# Patient Record
Sex: Male | Born: 1950 | Race: White | Hispanic: No | Marital: Married | Smoking: Never smoker
Health system: Southern US, Community
[De-identification: ages and names within clinical notes are randomized; demographics above are authoritative.]

## PROBLEM LIST (undated history)

## (undated) DIAGNOSIS — E291 Testicular hypofunction: Secondary | ICD-10-CM

## (undated) DIAGNOSIS — G4700 Insomnia, unspecified: Secondary | ICD-10-CM

## (undated) DIAGNOSIS — G4730 Sleep apnea, unspecified: Secondary | ICD-10-CM

## (undated) DIAGNOSIS — E785 Hyperlipidemia, unspecified: Secondary | ICD-10-CM

## (undated) DIAGNOSIS — N400 Enlarged prostate without lower urinary tract symptoms: Secondary | ICD-10-CM

## (undated) DIAGNOSIS — Z87442 Personal history of urinary calculi: Secondary | ICD-10-CM

## (undated) HISTORY — DX: Personal history of urinary calculi: Z87.442

## (undated) HISTORY — DX: Testicular hypofunction: E29.1

## (undated) HISTORY — DX: Benign prostatic hyperplasia without lower urinary tract symptoms: N40.0

## (undated) HISTORY — DX: Hyperlipidemia, unspecified: E78.5

## (undated) HISTORY — PX: ESOPHAGEAL DILATION: SHX303

## (undated) HISTORY — PX: APPENDECTOMY: SHX54

## (undated) HISTORY — PX: THYROID CYST EXCISION: SHX2511

## (undated) HISTORY — DX: Insomnia, unspecified: G47.00

## (undated) HISTORY — PX: LASER OF PROSTATE W/ GREEN LIGHT PVP: SHX1953

## (undated) HISTORY — DX: Sleep apnea, unspecified: G47.30

## (undated) NOTE — Anesthesia Postprocedure Evaluation (Signed)
 Formatting of this note is different from the original. Patient: Devin Adams Procedures performed: Procedure(s): EGD BALLOON DILATION REMOVAL FOREIGN BODY  Assessment: No complications from anesthesia  Last vitals:  Vitals Value Taken Time  BP 146/75 12/09/23 11:20  Temp 36.6 C 12/09/23 11:19  Pulse 76 12/09/23 11:20  Resp 16 12/09/23 11:20  SpO2 96 % 12/09/23 11:20   Post vital signs: stable  Respiratory: unassisted Airway patency: patent Cardiovascular: stable and blood pressure at baseline Mental Status: Awake, Responds verbally, Answers questions appropriately, and Performs simple tasks  Post-anesthesia pain: adequate analgesia  Hydration: euvolemic  Anesthetic complications: no  Nausea: No   Vomiting: No    Electronically signed by Duwaine JONELLE Lenis, DO at 12/09/2023 13:21 MDT

## (undated) NOTE — Anesthesia Preprocedure Evaluation (Signed)
 Formatting of this note is different from the original.  Anesthesia Evaluation   Patient summary reviewed and Nursing notes reviewed   No history of anesthetic complications  Airway  No increased risk of difficult airway Mallampati: III TM distance: <3 FB Neck ROM: limited   Dental  Patient has facial hair   Pulmonary   (+) sleep apnea on CPAP, decreased breath sounds  Cardiovascular   EKG reviewed Rhythm: regular Rate: normal   Neuro/Psych    Comments: Insomnia    GI/Hepatic/Renal   (+) chronic renal disease  Comments: H/O EOE;  s/p several food impactions, s/p three esophageal dilations  H/O nephrolithiasis   Endo/Other  (+) arthritis (s/p TKA in 2024), Positive Cancer (Skin CA), obesity   Abdominal  (+) obese  Other findings: Pt sitting up spitting saliva into emesis bag    Anesthesia Plan  ASA 2 - emergent   general  (R/B/A of GETA/RSI dw Pt and wife;  All Q's answered.  Electronically signed and performed by: Megan R Gatlin, DO, 12/09/2023 11:03    ) intravenous induction  Anesthetic plan and risks discussed with patient. Use of blood products discussed with patient who.    Preoperative Evaluation Electronically Signed and Performed By: Megan R Gatlin, DO, 12/09/2023 9:54    Electronically signed by Duwaine JONELLE Lenis, DO at 12/09/2023 13:04 MDT

---

## 2004-08-30 ENCOUNTER — Ambulatory Visit: Payer: Self-pay

## 2006-09-14 ENCOUNTER — Encounter: Payer: Self-pay | Admitting: Cardiology

## 2006-10-08 ENCOUNTER — Ambulatory Visit: Payer: Self-pay | Admitting: Cardiology

## 2006-10-14 ENCOUNTER — Ambulatory Visit: Payer: Self-pay | Admitting: Cardiology

## 2010-07-04 ENCOUNTER — Ambulatory Visit: Payer: Self-pay | Admitting: Family Medicine

## 2010-08-13 NOTE — Assessment & Plan Note (Signed)
[facility] OFFICE NOTE   NAME:Ess, GYAN CAMBRE                     MRN:          [id number]  DATE:10/08/2006                            DOB:          25-May-1950    This is a cardiology consultation/outpatient cardiac catheterization  note.   Mr. Wehmeyer is a delightful, 60 year old, married, white male who comes  with his wife today for a second opinion concerning the need for a heart  catheterization.   HISTORY OF PRESENT ILLNESS:  He is 60 years of age, married, has 3  children.  He has very few risk factors for coronary disease other than  male sex and age.  He has been having some shortness of breath with  exertion over the last 6 months.  He was referred to Dr. Welton Flakes by Dr.  Vonita Moss who performed a stress nuclear study.  He exercised for 10  minutes and 10 seconds reaching a met level of 13.5.  Blood pressure  increased to 170/72.  Maximum heart rate was well above target.  There  was no ST segment depression.  His EF was 67%, but he was told he had an  anterior wall fixed defect and an inferior reversible defect.  Cardiac  catheterization was recommended.  Ironically, all wall motion was  normal.  I do not have a lipid profile but he does not have this as a  risk factor.   MEDICATIONS:  1. Doxazosin 8 mg p.o. daily.  2. Gabapentin 300 mg p.o. q.h.s.  3. Multivitamin.   PAST MEDICAL HISTORY:  He has had appendectomy in 2005.   He is intolerant to an antibiotic but cannot remember the name which  causes a rash.   FAMILY HISTORY:  Negative for premature coronary disease.   SOCIAL HISTORY:  He is in [employer].  He talks on the phone a lot.  He does  not exercise on a regular basis.  He does not smoke, does not drink or  use recreational drugs.   REVIEW OF SYSTEMS:  He does have sleep apnea.  He has an enlarged  prostate and has problems with urinary frequency and urgency.  He has a  history of  gastroesophageal reflux.  The rest of the review of systems  is negative.   PHYSICAL EXAMINATION:  VITAL SIGNS:  His blood pressure is 128/68.  His  pulse is 64 and regular.  His baseline ECG is normal.  His weight is  223.  He is 5 feet 10 inches.  HEENT:  Normocephalic atraumatic.  PERRLA.  Extraocular movements  intact.  Sclerae are clear.  Facial symmetry is normal.  Dentition  satisfactory.  NECK:  Supple.  Carotids are full without bruits.  There is no  thyromegaly.  There is no JVD.  LUNGS:  Clear.  HEART:  Reveals a soft S1 S2.  PMI is poorly appreciated.  ABDOMEN:  Slightly protuberant.  Good bowel sounds.  No obvious  organomegaly.  EXTREMITIES:  Reveal no edema.  Pulses are brisk.  NEUROLOGIC:  Intact.   ASSESSMENT:  Dyspnea on exertion, rule  out obstructive coronary artery  disease or ischemic equivalency.   I have asked Mr. Gellert to obtain a CD of his nuclear images.  If they  do indeed show these changes I would recommend a heart catheterization.  Indication, risks, and potential benefits have been discussed.  He would  like this done as an outpatient at [facility].  If they do not  and it looks relatively normal, I would recommend risk factor  modification, particularly exercise 3 hours a week and weight reduction.  I would also like to know what his lipids are and, the patient's LDL  less than 100.     Thomas C. Daleen Squibb, MD, [facility]  Electronically Signed    TCW/MedQ  DD: 10/08/2006  DT: 10/08/2006  Job #: [id number]   cc:   Vonita Moss, Dr.

## 2010-08-13 NOTE — Assessment & Plan Note (Signed)
[facility] OFFICE NOTE   NAME:Ashmore, CHRISTOFFER CURRIER                     MRN:          [id number]  DATE:10/14/2006                            DOB:          [birth year]    Mr. Granville returns today to discuss further his need for heart  catheterization.  Please see my original note on October 08, 2006.   Again, I pressed him hard and he is really not having any symptoms of  angina other than becoming slightly short of breath with running up the  steps.  We asked him to obtain a CD scan of his nuclear stress study  with Dr. Welton Flakes, but was told they do not do CDs.   The report says 67% EF with normal wall motion with an anterior wall  fixed defect, inferior wall reversible defect.  His exercise tolerance  was excellent and his test was adequate.   He did obtain static color images and Dr. Antoine Poche and I have looked at  these.  They are completely normal as we can see.   I have had another 20 minute discussion with he and his wife.  I have  not recommended heart catheterization.  I reviewed ischemic symptoms,  both exertional and at rest and how to activate 911.  I have recommended  a fasting lipid panel which we will arrange so I can review.  We will  also call Dr. Milta Deiters office and see if there is any way we can get a CD.  It does not sound like we can.   Otherwise, I will see him back on a p.r.n. basis.   After this discussion, he and his wife understand and agree with the  plan.     Thomas C. Daleen Squibb, MD, [facility]  Electronically Signed    TCW/MedQ  DD: 10/14/2006  DT: 10/15/2006  Job #: [id number]   cc:   Vonita Moss, MD

## 2010-09-06 ENCOUNTER — Ambulatory Visit: Payer: Self-pay | Admitting: Unknown Physician Specialty

## 2010-09-19 ENCOUNTER — Encounter: Payer: Self-pay | Admitting: Cardiovascular Disease

## 2010-11-26 ENCOUNTER — Ambulatory Visit: Payer: Self-pay | Admitting: Unknown Physician Specialty

## 2011-02-26 ENCOUNTER — Ambulatory Visit: Payer: Self-pay | Admitting: Cardiovascular Disease

## 2011-03-06 ENCOUNTER — Encounter: Payer: Self-pay | Admitting: Cardiovascular Disease

## 2011-03-06 ENCOUNTER — Ambulatory Visit: Payer: Self-pay | Admitting: Cardiovascular Disease

## 2011-03-13 ENCOUNTER — Ambulatory Visit (INDEPENDENT_AMBULATORY_CARE_PROVIDER_SITE_OTHER): Payer: 59 | Admitting: Cardiovascular Disease

## 2011-03-13 ENCOUNTER — Encounter: Payer: Self-pay | Admitting: Cardiovascular Disease

## 2011-03-13 DIAGNOSIS — Z0000 Encounter for general adult medical examination without abnormal findings: Secondary | ICD-10-CM

## 2011-03-13 DIAGNOSIS — R0789 Other chest pain: Secondary | ICD-10-CM

## 2011-03-13 DIAGNOSIS — Z7189 Other specified counseling: Secondary | ICD-10-CM | POA: Insufficient documentation

## 2011-03-13 DIAGNOSIS — R0602 Shortness of breath: Secondary | ICD-10-CM

## 2011-03-13 DIAGNOSIS — R002 Palpitations: Secondary | ICD-10-CM

## 2011-03-13 DIAGNOSIS — E785 Hyperlipidemia, unspecified: Secondary | ICD-10-CM | POA: Insufficient documentation

## 2011-03-13 NOTE — Assessment & Plan Note (Signed)
His cholesterol numbers from 2000 and were excellent. We did suggest that he try to decrease his weight as this would help achieve a lower LDL.

## 2011-03-13 NOTE — Assessment & Plan Note (Signed)
Overall he is doing well. We have asked him to watch his weight and increase his exercise. Cholesterol and blood pressure well controlled, no family history of coronary artery disease, no diabetes. In general, I'm very happy with how he is doing.

## 2011-03-13 NOTE — Progress Notes (Signed)
Patient ID: Devin Adams, male    DOB: 14-Jan-1951, 60 y.o.   MRN: [id number]  HPI Comments: [name] is a [place] and 60 year old gentleman, patient of Dr. Dossie Arbour, with a history of hyperlipidemia, Dr. Sleep apnea, low testosterone, shortness of breath with heavy exertion with previous stress test in 2008 who presents by referral for further evaluation.  He reports that in general he is doing well. His weight is up a little bit through the winter though stable over the past several years. He is asymptomatic when walking on flat surfaces. He does have some shortness of breath with stairs. This has been stable and this was the reason for his previous stress test. Notes indicate that his stress test was evaluated by Dr. Daleen Squibb and Dr. Antoine Poche at [place] and it was not felt that he needed further workup such as a cardiac catheterization. The inferior wall was initially read as showing some reversible changes the evaluation of static images by the above doctors did not suggest ischemia. No cardiac catheterization was performed and he has done well since then.  He denies any progression in chest pain or shortness of breath. He is tolerating his medications well with no complaints. He has been tolerating lovastatin without any muscle ache or joint pain.  Total cholesterol from November 2000 was 156, LDL 97, HDL 37  EKG shows sinus bradycardia with rate 53 beats per minute with no significant ST or T wave changes   Outpatient Encounter Prescriptions as of 03/13/2011  Medication Sig Dispense Refill  . aspirin 81 MG tablet Take 81 mg by mouth daily.        Marland Kitchen doxazosin (CARDURA) 4 MG tablet Take 4 mg by mouth at bedtime.        . gabapentin (NEURONTIN) 300 MG capsule Take two tablets at bedtime.      . lovastatin (MEVACOR) 40 MG tablet Take 40 mg by mouth at bedtime.        . mometasone (NASONEX) 50 MCG/ACT nasal spray Place 2 sprays into the nose daily.        . naproxen (NAPROSYN) 500 MG tablet  Take 500 mg by mouth daily.           Review of Systems  Constitutional: Negative.   HENT: Negative.   Eyes: Negative.   Respiratory: Negative.        Some mild shortness of breath with stairs  Cardiovascular: Negative.   Gastrointestinal: Negative.   Musculoskeletal: Negative.   Skin: Negative.   Neurological: Negative.   Hematological: Negative.   Psychiatric/Behavioral: Negative.   All other systems reviewed and are negative.    BP 128/74  Pulse 53  Ht 5\' 11"  (1.803 m)  Wt 231 lb 8 oz (105.008 kg)  BMI 32.29 kg/m2  Physical Exam  Nursing note and vitals reviewed. Constitutional: He is oriented to person, place, and time. He appears well-developed and well-nourished.  HENT:  Head: Normocephalic.  Nose: Nose normal.  Mouth/Throat: Oropharynx is clear and moist.  Eyes: Conjunctivae are normal. Pupils are equal, round, and reactive to light.  Neck: Normal range of motion. Neck supple. No JVD present.  Cardiovascular: Normal rate, regular rhythm, S1 normal, S2 normal, normal heart sounds and intact distal pulses.  Exam reveals no gallop and no friction rub.   No murmur heard. Pulmonary/Chest: Effort normal and breath sounds normal. No respiratory distress. He has no wheezes. He has no rales. He exhibits no tenderness.  Abdominal: Soft. Bowel sounds are normal.  He exhibits no distension. There is no tenderness.  Musculoskeletal: Normal range of motion. He exhibits no edema and no tenderness.  Lymphadenopathy:    He has no cervical adenopathy.  Neurological: He is alert and oriented to person, place, and time. Coordination normal.  Skin: Skin is warm and dry. No rash noted. No erythema.  Psychiatric: He has a normal mood and affect. His behavior is normal. Judgment and thought content normal.           Assessment and Plan

## 2011-03-13 NOTE — Assessment & Plan Note (Signed)
He has mild stable shortness of breath with heavy exertion and stairs. Given his previous testing for similar symptoms with no significant change over the past 4 years, no further testing has been ordered. I suggested if his symptoms get worse with exertion, that he call our office for a treadmill study. After long discussion of the various testing protocols and options, we will watch him for now. He will be low risk of underlying coronary artery disease given her family history, no diabetes, no smoking history and well-controlled cholesterol. We have asked him to watch his weight as this could be contributing to his breathing. We have also asked him to work on his conditioning.

## 2011-03-13 NOTE — Patient Instructions (Addendum)
You are doing well. No medication changes were made.  Please call us if you have new issues that need to be addressed before your next appt.  The office will contact you for a follow up Appt. In 12 months  

## 2011-09-15 ENCOUNTER — Ambulatory Visit: Payer: Self-pay | Admitting: Urology

## 2011-12-18 ENCOUNTER — Ambulatory Visit: Payer: Self-pay | Admitting: Unknown Physician Specialty

## 2012-04-16 ENCOUNTER — Ambulatory Visit (INDEPENDENT_AMBULATORY_CARE_PROVIDER_SITE_OTHER): Payer: 59 | Admitting: Cardiovascular Disease

## 2012-04-16 ENCOUNTER — Encounter: Payer: Self-pay | Admitting: Cardiovascular Disease

## 2012-04-16 VITALS — BP 138/78 | HR 63 | Ht 70.0 in | Wt 228.8 lb

## 2012-04-16 DIAGNOSIS — E785 Hyperlipidemia, unspecified: Secondary | ICD-10-CM

## 2012-04-16 DIAGNOSIS — R0602 Shortness of breath: Secondary | ICD-10-CM

## 2012-04-16 NOTE — Assessment & Plan Note (Signed)
Cholesterol is at goal on the current lipid regimen. No changes to the medications were made. We have encouraged he try to lose several pounds.

## 2012-04-16 NOTE — Progress Notes (Signed)
Patient ID: Devin Adams, male    DOB: Jun 19, 1950, 62 y.o.   MRN: [id number]  HPI Comments: [name] is a  62 year old gentleman, patient of Dr. Dossie Arbour, with a history of hyperlipidemia,  Sleep apnea, low testosterone, previous history of shortness of breath with heavy exertion with previous stress test in 2008 who presents for routine followup  He reports that in general he is doing well. His weight is up a little bit through the winter though stable over the past several years.  He denies any changes. He is active with no complaints  Notes indicate that his stress test 2008 was evaluated by Dr. Daleen Squibb and Dr. Antoine Poche at [place] and it was not felt that he needed further workup such as a cardiac catheterization. The inferior wall was initially read as showing some reversible changes the evaluation of static images by the above doctors did not suggest ischemia. No cardiac catheterization was performed and he has done well since then.  Most recent lab work shows total cholesterol in the 160 range, LDL 75  EKG shows sinus bradycardia with rate 63 beats per minute with no significant ST or T wave changes   Outpatient Encounter Prescriptions as of 04/16/2012  Medication Sig Dispense Refill  . aspirin 81 MG tablet Take 81 mg by mouth daily.        Marland Kitchen doxazosin (CARDURA) 4 MG tablet Take 4 mg by mouth at bedtime.        . gabapentin (NEURONTIN) 300 MG capsule Take two tablets at bedtime.      . lovastatin (MEVACOR) 40 MG tablet Take 40 mg by mouth at bedtime.        . mometasone (NASONEX) 50 MCG/ACT nasal spray Place 2 sprays into the nose daily.        . naproxen (NAPROSYN) 500 MG tablet Take 500 mg by mouth daily.           Review of Systems  Constitutional: Negative.   HENT: Negative.   Eyes: Negative.   Respiratory: Negative.        Some mild shortness of breath with stairs  Cardiovascular: Negative.   Gastrointestinal: Negative.   Musculoskeletal: Negative.   Skin: Negative.    Neurological: Negative.   Hematological: Negative.   Psychiatric/Behavioral: Negative.   All other systems reviewed and are negative.    BP 138/78  Pulse 63  Ht 5\' 10"  (1.778 m)  Wt 228 lb 12 oz (103.76 kg)  BMI 32.82 kg/m2  Physical Exam  Nursing note and vitals reviewed. Constitutional: He is oriented to person, place, and time. He appears well-developed and well-nourished.  HENT:  Head: Normocephalic.  Nose: Nose normal.  Mouth/Throat: Oropharynx is clear and moist.  Eyes: Conjunctivae normal are normal. Pupils are equal, round, and reactive to light.  Neck: Normal range of motion. Neck supple. No JVD present.  Cardiovascular: Normal rate, regular rhythm, S1 normal, S2 normal, normal heart sounds and intact distal pulses.  Exam reveals no gallop and no friction rub.   No murmur heard. Pulmonary/Chest: Effort normal and breath sounds normal. No respiratory distress. He has no wheezes. He has no rales. He exhibits no tenderness.  Abdominal: Soft. Bowel sounds are normal. He exhibits no distension. There is no tenderness.  Musculoskeletal: Normal range of motion. He exhibits no edema and no tenderness.  Lymphadenopathy:    He has no cervical adenopathy.  Neurological: He is alert and oriented to person, place, and time. Coordination normal.  Skin: Skin  is warm and dry. No rash noted. No erythema.  Psychiatric: He has a normal mood and affect. His behavior is normal. Judgment and thought content normal.           Assessment and Plan

## 2012-04-16 NOTE — Assessment & Plan Note (Signed)
No progression in his symptoms. Overall he feels well and is active

## 2012-04-16 NOTE — Patient Instructions (Addendum)
You are doing well. No medication changes were made.  Please call us if you have new issues that need to be addressed before your next appt.  Your physician wants you to follow-up in: 12 months.  You will receive a reminder letter in the mail two months in advance. If you don't receive a letter, please call our office to schedule the follow-up appointment. 

## 2012-09-06 DIAGNOSIS — N400 Enlarged prostate without lower urinary tract symptoms: Secondary | ICD-10-CM | POA: Insufficient documentation

## 2012-09-06 DIAGNOSIS — E291 Testicular hypofunction: Secondary | ICD-10-CM | POA: Insufficient documentation

## 2012-09-06 DIAGNOSIS — R3911 Hesitancy of micturition: Secondary | ICD-10-CM | POA: Insufficient documentation

## 2013-04-18 ENCOUNTER — Ambulatory Visit: Payer: 59 | Admitting: Cardiovascular Disease

## 2013-05-02 ENCOUNTER — Ambulatory Visit: Payer: 59 | Admitting: Cardiovascular Disease

## 2013-05-03 ENCOUNTER — Ambulatory Visit (INDEPENDENT_AMBULATORY_CARE_PROVIDER_SITE_OTHER): Payer: 59 | Admitting: Cardiovascular Disease

## 2013-05-03 ENCOUNTER — Encounter: Payer: Self-pay | Admitting: Cardiovascular Disease

## 2013-05-03 VITALS — BP 122/64 | HR 61 | Ht 71.0 in | Wt 236.5 lb

## 2013-05-03 DIAGNOSIS — R079 Chest pain, unspecified: Secondary | ICD-10-CM

## 2013-05-03 DIAGNOSIS — E785 Hyperlipidemia, unspecified: Secondary | ICD-10-CM

## 2013-05-03 DIAGNOSIS — E669 Obesity, unspecified: Secondary | ICD-10-CM | POA: Insufficient documentation

## 2013-05-03 DIAGNOSIS — R0602 Shortness of breath: Secondary | ICD-10-CM

## 2013-05-03 DIAGNOSIS — Z0000 Encounter for general adult medical examination without abnormal findings: Secondary | ICD-10-CM

## 2013-05-03 MED ORDER — LOVASTATIN 40 MG PO TABS: 80.0000 mg | ORAL_TABLET | Freq: Every day | ORAL | Status: DC

## 2013-05-03 NOTE — Assessment & Plan Note (Signed)
Recommended he lose weight, start regular exercise program. Also recommended he increase his lovastatin to 40 mg twice a day. He is determined to lose weight in 6 months time.

## 2013-05-03 NOTE — Assessment & Plan Note (Signed)
He does report having some shortness of breath with stairs. Long discussion about this with him. He is okay on flat surfaces, mild exertional related fatigue with stairs and hills. It was found that his symptoms are predominantly secondary to conditioning and prior weight. Suggested he call our office if symptoms get worse. Stress test could be done.

## 2013-05-03 NOTE — Assessment & Plan Note (Signed)
We have encouraged continued exercise, careful diet management in an effort to lose weight. 

## 2013-05-03 NOTE — Assessment & Plan Note (Signed)
Main focus today was on weight loss, getting his cholesterol down, Also monitoring for new symptoms of shortness of breath with exertion. He will call us for new symptoms

## 2013-05-03 NOTE — Progress Notes (Signed)
Patient ID: Devin Adams, male    DOB: 1950/12/06, 63 y.o.   MRN: [id number]  HPI Comments: Mr. Devin Adams is a  63 -year-old gentleman, patient of Dr. Jeananne Rama, with a history of hyperlipidemia,  Sleep apnea, low testosterone, previous history of shortness of breath with heavy exertion with previous stress test in 2008 who presents for routine followup  Since his last clinic visit, he has gained over 10 pounds. He has been less active, not doing any regular exercise, eating more. Cholesterol has climbed from 160 range now up to 193, LDL of to 90 from 75 He continues on lovastatin 40 mg daily in general he is doing well.   He is active with no complaints  Notes indicate that his stress test 2008 was evaluated by Dr. Verl Blalock and Dr. Percival Spanish at [place] and it was not felt that he needed further workup such as a cardiac catheterization. The inferior wall was initially read as showing some reversible changes the evaluation of static images by the above doctors did not suggest ischemia. No cardiac catheterization was performed and he has done well since then.  EKG shows sinus bradycardia with rate 61 beats per minute with no significant ST or T wave changes   Outpatient Encounter Prescriptions as of 05/03/2013  Medication Sig  . aspirin 81 MG tablet Take 81 mg by mouth daily.    Marland Kitchen doxazosin (CARDURA) 4 MG tablet Take 4 mg by mouth at bedtime.    . gabapentin (NEURONTIN) 300 MG capsule Take two tablets at bedtime.  . lovastatin (MEVACOR) 40 MG tablet Take 40 mg by mouth at bedtime.    . mometasone (NASONEX) 50 MCG/ACT nasal spray Place 2 sprays into the nose daily.      Review of Systems  Constitutional: Negative.   HENT: Negative.   Eyes: Negative.   Respiratory: Negative.        Some mild shortness of breath with stairs  Cardiovascular: Negative.   Gastrointestinal: Negative.   Endocrine: Negative.   Musculoskeletal: Negative.   Skin: Negative.   Allergic/Immunologic: Negative.    Neurological: Negative.   Hematological: Negative.   Psychiatric/Behavioral: Negative.   All other systems reviewed and are negative.    BP 122/64  Pulse 61  Ht 5\' 11"  (1.803 m)  Wt 236 lb 8 oz (107.276 kg)  BMI 33.00 kg/m2  Physical Exam  Nursing note and vitals reviewed. Constitutional: He is oriented to person, place, and time. He appears well-developed and well-nourished.  HENT:  Head: Normocephalic.  Nose: Nose normal.  Mouth/Throat: Oropharynx is clear and moist.  Eyes: Conjunctivae are normal. Pupils are equal, round, and reactive to light.  Neck: Normal range of motion. Neck supple. No JVD present.  Cardiovascular: Normal rate, regular rhythm, S1 normal, S2 normal, normal heart sounds and intact distal pulses.  Exam reveals no gallop and no friction rub.   No murmur heard. Pulmonary/Chest: Effort normal and breath sounds normal. No respiratory distress. He has no wheezes. He has no rales. He exhibits no tenderness.  Abdominal: Soft. Bowel sounds are normal. He exhibits no distension. There is no tenderness.  Musculoskeletal: Normal range of motion. He exhibits no edema and no tenderness.  Lymphadenopathy:    He has no cervical adenopathy.  Neurological: He is alert and oriented to person, place, and time. Coordination normal.  Skin: Skin is warm and dry. No rash noted. No erythema.  Psychiatric: He has a normal mood and affect. His behavior is normal. Judgment and thought  content normal.      Assessment and Plan

## 2013-05-03 NOTE — Patient Instructions (Signed)
You are doing well. Please increase the lovastatin up to 80 mg daily  Please call us if you have new issues that need to be addressed before your next appt.  Your physician wants you to follow-up in: 12 months.  You will receive a reminder letter in the mail two months in advance. If you don't receive a letter, please call our office to schedule the follow-up appointment.

## 2013-11-28 ENCOUNTER — Ambulatory Visit: Payer: Self-pay | Admitting: Podiatry

## 2013-12-21 ENCOUNTER — Ambulatory Visit (INDEPENDENT_AMBULATORY_CARE_PROVIDER_SITE_OTHER): Payer: 59 | Admitting: Podiatry

## 2013-12-21 ENCOUNTER — Encounter: Payer: Self-pay | Admitting: Podiatry

## 2013-12-21 VITALS — BP 104/67 | HR 72 | Resp 16 | Ht 70.0 in | Wt 225.0 lb

## 2013-12-21 DIAGNOSIS — M722 Plantar fascial fibromatosis: Secondary | ICD-10-CM

## 2013-12-22 NOTE — Progress Notes (Signed)
Ritesh presents today states that his right heel is still bothering him. States that the last injection over a year ago did nothing for him. He's ready to have surgery performed to resolve his symptoms. He states it is starting to affect her daily activities. Chronic pain in the morning and after sitting for a while.  Objective: Vital signs are stable he is alert and oriented x3. Denies any change in his past medical history medications or allergies. He denies fever chills nausea vomiting muscle aches and pains other than right heel pain. Pulses are palpable right he has pain on palpation medial continued tubercle of the right heel at the plantar fascial calcaneal insertion site. The plantar fascia is taut and tender.  Assessment: Chronic intractable plantar fasciitis right heel.  Plan: Discussed etiology pathology conservative versus versus surgical therapies. He was scanned for a new set of orthotics today and we went over consent form today line bylined number by number giving him ample time to ask questions he saw regarding an endoscopic plantar fasciotomy of the right heel. I answered all the questions regarding this procedure to the best of my ability in layman's terms. He understood it was amenable to it and signed all 3 pages of the consent form. We did discuss a possible postop complications which may include but are not limited to postop pain bleeding swelling infection need for further surgery overcorrection under correction chronic pain and numbness. I will followup with him in the near future for surgery.

## 2014-01-11 ENCOUNTER — Ambulatory Visit (INDEPENDENT_AMBULATORY_CARE_PROVIDER_SITE_OTHER): Payer: 59 | Admitting: Podiatry

## 2014-01-11 DIAGNOSIS — M722 Plantar fascial fibromatosis: Secondary | ICD-10-CM

## 2014-01-11 NOTE — Patient Instructions (Signed)

## 2014-01-11 NOTE — Progress Notes (Signed)
Pt presents for orthotic pick up written and verbal instructions are given. He states it is doing much better he seems to have improved since he is been taking his own foot as well as wearing his orthotics and sleeping with his night splint. He states that he may like to hold off on surgery depending on how he feels in December.  Objective: Vital signs are stable he is alert and oriented x3 his orthotics were dispensed today.  Assessment: Resolving plantar fasciitis.  Plan: Or nursing staff demonstrated to him how to strap his own foot today. I will followup with him in one month if necessary.

## 2014-03-07 ENCOUNTER — Encounter: Payer: Self-pay | Admitting: *Deleted

## 2014-03-07 NOTE — Progress Notes (Signed)
Pt called and cancelled surgery for 12.18.15. Stated he is doing much better with wearing his night boot.

## 2014-03-21 ENCOUNTER — Ambulatory Visit: Payer: Self-pay | Admitting: Urology

## 2014-03-21 LAB — HEMOGLOBIN: HGB: 14.4 g/dL (ref 13.0–18.0)

## 2014-03-22 ENCOUNTER — Encounter: Payer: 59 | Admitting: Podiatry

## 2014-03-28 ENCOUNTER — Ambulatory Visit: Payer: Self-pay | Admitting: Urology

## 2014-05-10 ENCOUNTER — Ambulatory Visit: Payer: Self-pay | Admitting: Cardiovascular Disease

## 2014-05-19 ENCOUNTER — Ambulatory Visit (INDEPENDENT_AMBULATORY_CARE_PROVIDER_SITE_OTHER): Payer: 59 | Admitting: Cardiovascular Disease

## 2014-05-19 ENCOUNTER — Encounter: Payer: Self-pay | Admitting: Cardiovascular Disease

## 2014-05-19 VITALS — BP 128/90 | HR 58 | Ht 70.0 in | Wt 228.8 lb

## 2014-05-19 DIAGNOSIS — Z0000 Encounter for general adult medical examination without abnormal findings: Secondary | ICD-10-CM

## 2014-05-19 DIAGNOSIS — E669 Obesity, unspecified: Secondary | ICD-10-CM

## 2014-05-19 DIAGNOSIS — R0602 Shortness of breath: Secondary | ICD-10-CM

## 2014-05-19 DIAGNOSIS — R7301 Impaired fasting glucose: Secondary | ICD-10-CM

## 2014-05-19 DIAGNOSIS — E785 Hyperlipidemia, unspecified: Secondary | ICD-10-CM

## 2014-05-19 NOTE — Assessment & Plan Note (Signed)
Cholesterol is at goal on the current lipid regimen. No changes to the medications were made.  

## 2014-05-19 NOTE — Assessment & Plan Note (Signed)
Labs reviewed with him in detail. Recommended weight loss. Discussed food choices in an effort to lose weight

## 2014-05-19 NOTE — Assessment & Plan Note (Signed)
Very mild chronic shortness of breath likely secondary to deconditioning, weight. Recommended a regular exercise program

## 2014-05-19 NOTE — Progress Notes (Signed)
Patient ID: Devin Adams, male    DOB: 05/03/1950, 64 y.o.   MRN: [id number]  HPI Comments: Devin Adams is a  64 -year-old gentleman, patient of Dr. Jeananne Rama, with a history of hyperlipidemia,  Sleep apnea, low testosterone, previous history of shortness of breath with heavy exertion with previous stress test in 2008 who presents for routine followup of his hyperlipidemia  In follow-up today, weight is down from his prior clinic visit but up from his baseline of 220 pounds. Currently 228 pounds He reports that he recently had a family member pass and neighbors were bringing him lots of food. Also has been eating out more Recent blood work showing total cholesterol 148, LDL 64, HDL 45 Fasting glucose 120s Results were discussed with him in detail He is taking lovastatin 40 mg daily Denies any significant chest pain, shortness of breath with exertion. His active at baseline, no regular exercise program  EKG done on today's visit shows normal sinus rhythm with rate 58 bpm, no significant ST or T-wave changes  Other past medical history Notes indicate that his stress test 2008 was evaluated by Dr. Verl Blalock and Dr. Percival Spanish at [facility] and it was not felt that he needed further workup such as a cardiac catheterization. The inferior wall was initially read as showing some reversible changes the evaluation of static images by the above doctors did not suggest ischemia. No cardiac catheterization was performed and he has done well since then.  No Known Allergies  Outpatient Encounter Prescriptions as of 05/19/2014  Medication Sig  . aspirin 81 MG tablet Take 81 mg by mouth daily.    Marland Kitchen gabapentin (NEURONTIN) 300 MG capsule Take two tablets at bedtime.  . lovastatin (MEVACOR) 40 MG tablet Take 2 tablets (80 mg total) by mouth at bedtime.  . mometasone (NASONEX) 50 MCG/ACT nasal spray Place 2 sprays into the nose daily.    . [DISCONTINUED] doxazosin (CARDURA) 4 MG tablet Take 4 mg by mouth at bedtime.       Past Medical History  Diagnosis Date  . Hyperlipidemia   . Insomnia, unspecified   . Sleep apnea   . Other testicular hypofunction   . Enlarged prostate     Past Surgical History  Procedure Laterality Date  . Appendectomy    . Esophageal dilation    . Thyroid cyst excision      Social History  reports that he has never smoked. He does not have any smokeless tobacco history on file. He reports that he drinks alcohol. He reports that he does not use illicit drugs.  Family History family history includes Heart failure in an other family member.  Review of Systems  Constitutional: Negative.   Respiratory: Negative.        Some mild shortness of breath with stairs  Cardiovascular: Negative.   Gastrointestinal: Negative.   Endocrine: Negative.   Musculoskeletal: Negative.   Skin: Negative.   Neurological: Negative.   Hematological: Negative.   Psychiatric/Behavioral: Negative.   All other systems reviewed and are negative.   BP 128/90 mmHg  Pulse 58  Ht 5\' 10"  (1.778 m)  Wt 228 lb 12 oz (103.76 kg)  BMI 32.82 kg/m2  Physical Exam  Constitutional: He is oriented to person, place, and time. He appears well-developed and well-nourished.  HENT:  Head: Normocephalic.  Nose: Nose normal.  Mouth/Throat: Oropharynx is clear and moist.  Eyes: Conjunctivae are normal. Pupils are equal, round, and reactive to light.  Neck: Normal range of motion.  Neck supple. No JVD present.  Cardiovascular: Normal rate, regular rhythm, S1 normal, S2 normal, normal heart sounds and intact distal pulses.  Exam reveals no gallop and no friction rub.   No murmur heard. Pulmonary/Chest: Effort normal and breath sounds normal. No respiratory distress. He has no wheezes. He has no rales. He exhibits no tenderness.  Abdominal: Soft. Bowel sounds are normal. He exhibits no distension. There is no tenderness.  Musculoskeletal: Normal range of motion. He exhibits no edema or tenderness.   Lymphadenopathy:    He has no cervical adenopathy.  Neurological: He is alert and oriented to person, place, and time. Coordination normal.  Skin: Skin is warm and dry. No rash noted. No erythema.  Psychiatric: He has a normal mood and affect. His behavior is normal. Judgment and thought content normal.      Assessment and Plan   Nursing note and vitals reviewed.

## 2014-05-19 NOTE — Patient Instructions (Signed)
You are doing well. No medication changes were made.  Please call us if you have new issues that need to be addressed before your next appt.  Your physician wants you to follow-up in: 12 months.  You will receive a reminder letter in the mail two months in advance. If you don't receive a letter, please call our office to schedule the follow-up appointment. 

## 2014-05-19 NOTE — Assessment & Plan Note (Signed)
We have encouraged continued exercise, careful diet management in an effort to lose weight. 

## 2014-07-22 NOTE — H&P (Signed)
PATIENT NAME:  Devin Adams, Devin Adams MR#:  [id number] DATE OF BIRTH:  October 23, 1950  DATE OF ADMISSION:  03/21/2014  SCHEDULED DATE OF SURGERY: 03/28/2014.   CHIEF COMPLAINT: Difficulty voiding.   HISTORY OF PRESENT ILLNESS: Mr. Mozer is a 64 year old white male with a greater than four year history of difficulty voiding. He had failed medical therapy and underwent TUMT July 28. His symptoms have not improved significantly. He comes in now for photovaporization of the prostate with a green light laser. Prosthetic ultrasound indicated a 48.4 gram prostate. PSA was 4.0 ng/mL. AUA symptom score was 25 with a quality of life score of 7. He is currently on tamsulosin and doxazosin for benign prostatic hypertrophy.   PAST MEDICAL HISTORY   ALLERGIES: No drug allergies.   CURRENT MEDICATIONS:  Aspirin, doxazosin, gabapentin, lovastatin, fish oil, multivitamins and tamsulosin.   PAST SURGICAL HISTORY:  Includes: 1.  Appendectomy in 2010.  2.  Removal of thyroid gland cyst in 2012.   SOCIAL HISTORY: Denied tobacco use. Consumes 1 to 4 alcoholic beverages per week.   FAMILY HISTORY: Parents with hypertension. There is no family history of prostate cancer.   PAST AND CURRENT MEDICAL CONDITIONS:    1.  Sleep apnea - the patient uses a CPAP machine.  2.  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 The patient uses gabapentin for sleep assistance.  3.  History of recurrent kidney stones.  4.  Hypergonadism.   5.  Erectile dysfunction.   REVIEW OF SYSTEMS: The patient has chest pain, shortness of breath, diabetes, or stroke or hypertension.   PHYSICAL EXAMINATION:   GENERAL: A well-nourished white male in no distress.   HEENT: Sclerae were clear. Pupils were equally round, reactive to light and accommodation. Extraocular movements were intact.   NECK: Supple. No palpable cervical masses. No audible carotid bruits.   LUNGS: Clear to auscultation.   CARDIOVASCULAR: Regular rhythm and rate  without audible murmurs.   ABDOMEN: Soft, nontender abdomen.   GENITOURINARY: Circumcised. Testes smooth and nontender, 20 mL size each.   RECTAL:  Forty grams, smooth nontender prostate.   NEUROMUSCULAR: Alert and oriented x3.   IMPRESSION: Benign prostatic hypertrophy with bladder outlet obstruction.   PLAN: Photovaporization of the prostate with green light laser.     ____________________________ Otelia Limes. Yves Dill, MD mrw:at D: 03/22/2014 11:38:57 ET T: 03/22/2014 11:50:48 ET JOB#: [id number]  cc: Otelia Limes. Yves Dill, MD, <Dictator>

## 2014-07-22 NOTE — H&P (Signed)
PATIENT NAME:  Devin Adams, Devin Adams MR#:  [id number] DATE OF BIRTH:  [birth year]  DATE OF ADMISSION:  03/28/2014  CHIEF COMPLAINT: Difficulty voiding.   HISTORY OF PRESENT ILLNESS: Mr. Mcclean is a 64 year old white male with a greater than four year history of difficulty voiding. He had failed medical therapy and underwent TUMT July 28. His symptoms have not improved significantly. He comes in now for photovaporization of the prostate with a green light laser. Prostate ultrasound indicated a 48.4 gram prostate. PSA was 4.0 ng/mL. AUA symptom score was 25 with a quality of life score of 7. He is currently on tamsulosin and doxazosin for benign prostatic hypertrophy.   PAST MEDICAL HISTORY   ALLERGIES: No drug allergies.   CURRENT MEDICATIONS:  Aspirin, doxazosin, gabapentin, lovastatin, fish oil, multivitamins and tamsulosin.   PAST SURGICAL HISTORY:  Includes: 1.  Appendectomy in 2010.  2.  Removal of thyroid gland cyst in 2012.   SOCIAL HISTORY: Denied tobacco use. Consumes 1 to 4 alcoholic beverages per week.   FAMILY HISTORY: Parents with hypertension. There is no family history of prostate cancer.   PAST AND CURRENT MEDICAL CONDITIONS:    1.  Sleep apnea - the patient uses a CPAP machine.  2.  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 The patient uses gabapentin for sleep assistance.  3.  History of recurrent kidney stones.  4.  Hypergonadism.   5.  Erectile dysfunction.   REVIEW OF SYSTEMS: The patient has chest pain, shortness of breath, diabetes, or stroke or hypertension.   PHYSICAL EXAMINATION:   GENERAL: A well-nourished white male in no distress.   HEENT: Sclerae were clear. Pupils were equally round, reactive to light and accommodation. Extraocular movements were intact.   NECK: Supple. No palpable cervical masses. No audible carotid bruits.   LUNGS: Clear to auscultation.   CARDIOVASCULAR: Regular rhythm and rate without audible murmurs.   ABDOMEN: Soft,  nontender abdomen.   GENITOURINARY: Circumcised. Testes smooth and nontender, 20 mL size each.   RECTAL:  Forty grams, smooth nontender prostate.   NEUROMUSCULAR: Alert and oriented x3.   IMPRESSION: Benign prostatic hypertrophy with bladder outlet obstruction.   PLAN: Photovaporization of the prostate with green light laser.    ____________________________ Otelia Limes. Yves Dill, MD mrw:at D: 03/22/2014 11:38:00 ET T: 03/22/2014 11:50:48 ET JOB#: [id number]  cc: Otelia Limes. Yves Dill, MD, <Dictator> Royston Cowper MD ELECTRONICALLY SIGNED 03/22/2014 15:33

## 2014-07-22 NOTE — Op Note (Signed)
PATIENT NAME:  Devin Adams, Devin Adams MR#:  [id number] DATE OF BIRTH:  09-11-1950  DATE OF PROCEDURE:  03/28/2014  PREOPERATIVE DIAGNOSIS: Benign prostatic hypertrophy with bladder outlet obstruction.   POSTOPERATIVE DIAGNOSIS: Benign prostatic hypertrophy with bladder outlet obstruction.   PROCEDURE: Photovaporization of the prostate with the GreenLight laser.   SURGEON: Maryan Puls, MD   ANESTHETISTSheral Flow F. Boston Service, MD  ANESTHETIC METHOD:  General.   INDICATIONS: See the dictated history and physical. After informed consent, the patient requests the above procedure.   OPERATIVE SUMMARY: After adequate general anesthesia had been obtained, the patient was placed into dorsal lithotomy position and the perineum was prepped and draped in the usual fashion. The laser scope was coupled with the camera and then visually advanced into the bladder. The patient was noted have trilobar BPH with visual obstruction. Bladder was moderately trabeculated. Both ureteral orifices were identified and had clear efflux. No bladder mucosal lesions were identified. At this point, the GreenLight XPS laser fiber was introduced through the scope and set at 80 watts of power. Median lobe and bladder neck tissue was vaporized. Power was then increased to 120 watts and remaining obstructive tissue from the bladder neck to the verumontanum was vaporized. At this point, scope was removed and 10 mL of viscous Xylocaine instilled within the urethra and the bladder. A 20 French silicone Foley catheter was placed. The catheter was irrigated until clear. A B and O suppository was placed. The procedure was then terminated and the patient was transferred to the recovery room in stable condition.    ____________________________ Otelia Limes. Yves Dill, MD mrw:TT D: 03/28/2014 15:06:23 ET T: 03/28/2014 16:11:01 ET JOB#: [id number]  cc: Otelia Limes. Yves Dill, MD, <Dictator> Royston Cowper MD ELECTRONICALLY SIGNED 03/28/2014  18:20

## 2014-10-09 ENCOUNTER — Ambulatory Visit: Payer: Self-pay | Admitting: Family Medicine

## 2014-12-28 ENCOUNTER — Ambulatory Visit (INDEPENDENT_AMBULATORY_CARE_PROVIDER_SITE_OTHER): Payer: 59 | Admitting: Family Medicine

## 2014-12-28 ENCOUNTER — Encounter: Payer: Self-pay | Admitting: Family Medicine

## 2014-12-28 VITALS — BP 128/80 | HR 51 | Temp 97.8°F | Ht 70.0 in | Wt 226.4 lb

## 2014-12-28 DIAGNOSIS — Z23 Encounter for immunization: Secondary | ICD-10-CM

## 2014-12-28 DIAGNOSIS — F329 Major depressive disorder, single episode, unspecified: Secondary | ICD-10-CM

## 2014-12-28 DIAGNOSIS — G4730 Sleep apnea, unspecified: Secondary | ICD-10-CM | POA: Diagnosis not present

## 2014-12-28 DIAGNOSIS — F32A Depression, unspecified: Secondary | ICD-10-CM

## 2014-12-28 DIAGNOSIS — R5383 Other fatigue: Secondary | ICD-10-CM | POA: Insufficient documentation

## 2014-12-28 MED ORDER — FLUOXETINE HCL 20 MG PO TABS: 20.0000 mg | ORAL_TABLET | Freq: Every day | ORAL | Status: DC

## 2014-12-28 NOTE — Assessment & Plan Note (Signed)
Reviewed to caused by depression Reviewed stress Reviewed lifestyle with exercise diet activity Reviewed taking extra things office plate Reviewed suicidal risk Reviewed starting medications Reviewed labs and we will draw labs today to further rule out metabolic

## 2014-12-28 NOTE — Assessment & Plan Note (Signed)
Patient uses CPAP faithfully

## 2014-12-28 NOTE — Progress Notes (Signed)
   BP 128/80 mmHg  Pulse 51  Temp(Src) 97.8 F (36.6 C)  Ht 5\' 10"  (1.778 m)  Wt 226 lb 6.4 oz (102.694 kg)  BMI 32.48 kg/m2  SpO2 95%   Subjective:    Patient ID: Devin Adams, male    DOB: 29-Jan-1951, 64 y.o.   MRN: [id number]  HPI: Devin Adams is a 64 y.o. male  Chief Complaint  Patient presents with  . Depression    Duration: Past 6 months  . Anxiety   patient under great stress with moving remodeling a house work stress Also a lot of family stress arguing with his wife more. Patient with poor sleep early morning wakening Loss of interest in usual things Feeling sad and blue No suicidal ideation No energy Tearful Fatigue is been patient's predominant symptom     Relevant past medical, surgical, family and social history reviewed and updated as indicated. Interim medical history since our last visit reviewed. Allergies and medications reviewed and updated.  Review of Systems  Constitutional: Positive for fatigue. Negative for fever.  HENT: Negative.   Respiratory: Negative.   Cardiovascular: Negative.     Per HPI unless specifically indicated above     Objective:    BP 128/80 mmHg  Pulse 51  Temp(Src) 97.8 F (36.6 C)  Ht 5\' 10"  (1.778 m)  Wt 226 lb 6.4 oz (102.694 kg)  BMI 32.48 kg/m2  SpO2 95%  Wt Readings from Last 3 Encounters:  12/28/14 226 lb 6.4 oz (102.694 kg)  05/19/14 228 lb 12 oz (103.76 kg)  12/21/13 225 lb (102.059 kg)    Physical Exam  Constitutional: He is oriented to person, place, and time. He appears well-developed and well-nourished. No distress.  HENT:  Head: Normocephalic and atraumatic.  Right Ear: Hearing normal.  Left Ear: Hearing normal.  Nose: Nose normal.  Eyes: Conjunctivae and lids are normal. Right eye exhibits no discharge. Left eye exhibits no discharge. No scleral icterus.  Cardiovascular: Normal rate, regular rhythm and normal heart sounds.   Pulmonary/Chest: Effort normal and breath sounds normal. No  respiratory distress.  Musculoskeletal: Normal range of motion.  Neurological: He is alert and oriented to person, place, and time.  Skin: Skin is intact. No rash noted.  Psychiatric: He has a normal mood and affect. His speech is normal and behavior is normal. Judgment and thought content normal. Cognition and memory are normal.  Depression screen see copy scored 13 PH Q 9        Assessment & Plan:   Problem List Items Addressed This Visit    None    Visit Diagnoses    Need for influenza vaccination    -  Primary    Relevant Orders    Flu Vaccine QUAD 36+ mos PF IM (Fluarix & Fluzone Quad PF) (Completed)        Follow up plan: No Follow-up on file.

## 2014-12-29 LAB — COMPREHENSIVE METABOLIC PANEL
A/G RATIO: 2.3 (ref 1.1–2.5)
ALK PHOS: 81 IU/L (ref 39–117)
ALT: 44 IU/L (ref 0–44)
AST: 41 IU/L — ABNORMAL HIGH (ref 0–40)
Albumin: 4.6 g/dL (ref 3.6–4.8)
BILIRUBIN TOTAL: 0.4 mg/dL (ref 0.0–1.2)
BUN/Creatinine Ratio: 16 (ref 10–22)
BUN: 19 mg/dL (ref 8–27)
CHLORIDE: 103 mmol/L (ref 97–108)
CO2: 22 mmol/L (ref 18–29)
Calcium: 9.4 mg/dL (ref 8.6–10.2)
Creatinine, Ser: 1.16 mg/dL (ref 0.76–1.27)
GFR calc non Af Amer: 66 mL/min/{1.73_m2} (ref >59)
GFR, EST AFRICAN AMERICAN: 77 mL/min/{1.73_m2} (ref >59)
Globulin, Total: 2 g/dL (ref 1.5–4.5)
Glucose: 94 mg/dL (ref 65–99)
POTASSIUM: 4.6 mmol/L (ref 3.5–5.2)
Sodium: 142 mmol/L (ref 134–144)
Total Protein: 6.6 g/dL (ref 6.0–8.5)

## 2014-12-29 LAB — CBC WITH DIFFERENTIAL/PLATELET
BASOS: 1 %
Basophils Absolute: 0 10*3/uL (ref 0.0–0.2)
EOS (ABSOLUTE): 0.4 10*3/uL (ref 0.0–0.4)
EOS: 6 %
HEMATOCRIT: 40.9 % (ref 37.5–51.0)
HEMOGLOBIN: 14.2 g/dL (ref 12.6–17.7)
Immature Grans (Abs): 0 10*3/uL (ref 0.0–0.1)
Immature Granulocytes: 0 %
LYMPHS ABS: 1.6 10*3/uL (ref 0.7–3.1)
Lymphs: 22 %
MCH: 30.3 pg (ref 26.6–33.0)
MCHC: 34.7 g/dL (ref 31.5–35.7)
MCV: 87 fL (ref 79–97)
MONOCYTES: 8 %
MONOS ABS: 0.6 10*3/uL (ref 0.1–0.9)
NEUTROS ABS: 4.5 10*3/uL (ref 1.4–7.0)
Neutrophils: 63 %
Platelets: 324 10*3/uL (ref 150–379)
RBC: 4.69 x10E6/uL (ref 4.14–5.80)
RDW: 13.2 % (ref 12.3–15.4)
WBC: 7.2 10*3/uL (ref 3.4–10.8)

## 2014-12-29 LAB — TSH: TSH: 1.88 u[IU]/mL (ref 0.450–4.500)

## 2015-01-01 ENCOUNTER — Encounter: Payer: Self-pay | Admitting: Family Medicine

## 2015-01-03 ENCOUNTER — Other Ambulatory Visit: Payer: Self-pay | Admitting: Family Medicine

## 2015-01-11 ENCOUNTER — Ambulatory Visit (INDEPENDENT_AMBULATORY_CARE_PROVIDER_SITE_OTHER): Payer: 59 | Admitting: Family Medicine

## 2015-01-11 ENCOUNTER — Encounter: Payer: Self-pay | Admitting: Family Medicine

## 2015-01-11 VITALS — BP 133/74 | HR 61 | Temp 98.0°F | Ht 68.3 in | Wt 219.0 lb

## 2015-01-11 DIAGNOSIS — R5383 Other fatigue: Secondary | ICD-10-CM | POA: Diagnosis not present

## 2015-01-11 DIAGNOSIS — F32A Depression, unspecified: Secondary | ICD-10-CM

## 2015-01-11 DIAGNOSIS — F329 Major depressive disorder, single episode, unspecified: Secondary | ICD-10-CM

## 2015-01-11 MED ORDER — FLUOXETINE HCL 20 MG PO TABS: 20.0000 mg | ORAL_TABLET | Freq: Two times a day (BID) | ORAL | Status: DC

## 2015-01-11 NOTE — Progress Notes (Signed)
BP 133/74 mmHg  Pulse 61  Temp(Src) 98 F (36.7 C)  Ht 5' 8.3" (1.735 m)  Wt 219 lb (99.338 kg)  BMI 33.00 kg/m2  SpO2 97%   Subjective:    Patient ID: Devin Adams, male    DOB: September 26, 1950, 64 y.o.   MRN: [id number]  HPI: Devin Adams is a 64 y.o. male  Chief Complaint  Patient presents with  . Follow-up   Patient doing okay at best on fluoxetine. No side effects has been taking every day still having early morning wakening. PH Q9 score of 12. No hypomania symptoms.  Relevant past medical, surgical, family and social history reviewed and updated as indicated. Interim medical history since our last visit reviewed. Allergies and medications reviewed and updated.  Review of Systems  Constitutional: Negative.   Respiratory: Negative.   Cardiovascular: Negative.     Per HPI unless specifically indicated above     Objective:    BP 133/74 mmHg  Pulse 61  Temp(Src) 98 F (36.7 C)  Ht 5' 8.3" (1.735 m)  Wt 219 lb (99.338 kg)  BMI 33.00 kg/m2  SpO2 97%  Wt Readings from Last 3 Encounters:  01/11/15 219 lb (99.338 kg)  12/28/14 226 lb 6.4 oz (102.694 kg)  05/19/14 228 lb 12 oz (103.76 kg)    Physical Exam  Constitutional: He is oriented to person, place, and time. He appears well-developed and well-nourished. No distress.  HENT:  Head: Normocephalic and atraumatic.  Right Ear: Hearing normal.  Left Ear: Hearing normal.  Nose: Nose normal.  Eyes: Conjunctivae and lids are normal. Right eye exhibits no discharge. Left eye exhibits no discharge. No scleral icterus.  Pulmonary/Chest: Effort normal. No respiratory distress.  Musculoskeletal: Normal range of motion.  Neurological: He is alert and oriented to person, place, and time.  Skin: Skin is intact. No rash noted.  Psychiatric: He has a normal mood and affect. His speech is normal and behavior is normal. Judgment and thought content normal. Cognition and memory are normal.    Results for orders placed  or performed in visit on 12/28/14  Comprehensive metabolic panel  Result Value Ref Range   Glucose 94 65 - 99 mg/dL   BUN 19 8 - 27 mg/dL   Creatinine, Ser 1.16 0.76 - 1.27 mg/dL   GFR calc non Af Amer 66 >59 mL/min/1.73   GFR calc Af Amer 77 >59 mL/min/1.73   BUN/Creatinine Ratio 16 10 - 22   Sodium 142 134 - 144 mmol/L   Potassium 4.6 3.5 - 5.2 mmol/L   Chloride 103 97 - 108 mmol/L   CO2 22 18 - 29 mmol/L   Calcium 9.4 8.6 - 10.2 mg/dL   Total Protein 6.6 6.0 - 8.5 g/dL   Albumin 4.6 3.6 - 4.8 g/dL   Globulin, Total 2.0 1.5 - 4.5 g/dL   Albumin/Globulin Ratio 2.3 1.1 - 2.5   Bilirubin Total 0.4 0.0 - 1.2 mg/dL   Alkaline Phosphatase 81 39 - 117 IU/L   AST 41 (H) 0 - 40 IU/L   ALT 44 0 - 44 IU/L  CBC with Differential/Platelet  Result Value Ref Range   WBC 7.2 3.4 - 10.8 x10E3/uL   RBC 4.69 4.14 - 5.80 x10E6/uL   Hemoglobin 14.2 12.6 - 17.7 g/dL   Hematocrit 40.9 37.5 - 51.0 %   MCV 87 79 - 97 fL   MCH 30.3 26.6 - 33.0 pg   MCHC 34.7 31.5 - 35.7 g/dL  RDW 13.2 12.3 - 15.4 %   Platelets 324 150 - 379 x10E3/uL   Neutrophils 63 %   Lymphs 22 %   Monocytes 8 %   Eos 6 %   Basos 1 %   Neutrophils Absolute 4.5 1.4 - 7.0 x10E3/uL   Lymphocytes Absolute 1.6 0.7 - 3.1 x10E3/uL   Monocytes Absolute 0.6 0.1 - 0.9 x10E3/uL   EOS (ABSOLUTE) 0.4 0.0 - 0.4 x10E3/uL   Basophils Absolute 0.0 0.0 - 0.2 x10E3/uL   Immature Granulocytes 0 %   Immature Grans (Abs) 0.0 0.0 - 0.1 x10E3/uL  TSH  Result Value Ref Range   TSH 1.880 0.450 - 4.500 uIU/mL      Assessment & Plan:   Problem List Items Addressed This Visit      Other   Depression - Primary    Discuss depression care and treatment, that we are encouraged by slow response not a rapid response. Patient having no side effects will increase to 40 mg      Relevant Medications   FLUoxetine (PROZAC) 20 MG tablet   Fatigue   Relevant Medications   FLUoxetine (PROZAC) 20 MG tablet       Follow up plan: Return in about 4  weeks (around 02/08/2015) for recheck depression.

## 2015-01-11 NOTE — Assessment & Plan Note (Signed)
Discuss depression care and treatment, that we are encouraged by slow response not a rapid response. Patient having no side effects will increase to 40 mg

## 2015-01-16 ENCOUNTER — Telehealth: Payer: Self-pay | Admitting: Family Medicine

## 2015-01-16 NOTE — Telephone Encounter (Signed)
Pt called stated he needs a refill on Prozac. [facility] in [place]. Stated this should have been sent previously. Thanks.

## 2015-02-08 ENCOUNTER — Encounter: Payer: Self-pay | Admitting: Family Medicine

## 2015-02-08 ENCOUNTER — Ambulatory Visit (INDEPENDENT_AMBULATORY_CARE_PROVIDER_SITE_OTHER): Payer: 59 | Admitting: Family Medicine

## 2015-02-08 VITALS — BP 132/80 | HR 82 | Temp 98.1°F | Ht 69.7 in | Wt 221.0 lb

## 2015-02-08 DIAGNOSIS — F329 Major depressive disorder, single episode, unspecified: Secondary | ICD-10-CM

## 2015-02-08 DIAGNOSIS — F32A Depression, unspecified: Secondary | ICD-10-CM

## 2015-02-08 MED ORDER — VENLAFAXINE HCL ER 75 MG PO CP24: 75.0000 mg | ORAL_CAPSULE | Freq: Every day | ORAL | Status: DC

## 2015-02-08 NOTE — Progress Notes (Signed)
BP 132/80 mmHg  Pulse 82  Temp(Src) 98.1 F (36.7 C)  Ht 5' 9.7" (1.77 m)  Wt 221 lb (100.245 kg)  BMI 32.00 kg/m2  SpO2 98%   Subjective:    Patient ID: Devin Adams, male    DOB: 1951-02-06, 64 y.o.   MRN: [id number]  HPI: Devin Adams is a 64 y.o. male  Chief Complaint  Patient presents with  . Depression   Review patient reports 50-60% better depression screen last month was 12 this month 14 Main problems early morning wakening 3:00 every morning. Lays in bed and tosses and turns. Eventually gets up. Going to bed most nights 10:00 Relevant past medical, surgical, family and social history reviewed and updated as indicated. Interim medical history since our last visit reviewed. Allergies and medications reviewed and updated.  Review of Systems  Constitutional: Negative.   Respiratory: Negative.   Cardiovascular: Negative.     Per HPI unless specifically indicated above     Objective:    BP 132/80 mmHg  Pulse 82  Temp(Src) 98.1 F (36.7 C)  Ht 5' 9.7" (1.77 m)  Wt 221 lb (100.245 kg)  BMI 32.00 kg/m2  SpO2 98%  Wt Readings from Last 3 Encounters:  02/08/15 221 lb (100.245 kg)  01/11/15 219 lb (99.338 kg)  12/28/14 226 lb 6.4 oz (102.694 kg)    Physical Exam  Constitutional: He is oriented to person, place, and time. He appears well-developed and well-nourished. No distress.  HENT:  Head: Normocephalic and atraumatic.  Right Ear: Hearing normal.  Left Ear: Hearing normal.  Nose: Nose normal.  Eyes: Conjunctivae and lids are normal. Right eye exhibits no discharge. Left eye exhibits no discharge. No scleral icterus.  Cardiovascular: Normal rate, regular rhythm and normal heart sounds.   Pulmonary/Chest: Effort normal and breath sounds normal. No respiratory distress.  Musculoskeletal: Normal range of motion.  Neurological: He is alert and oriented to person, place, and time.  Skin: Skin is intact. No rash noted.  Psychiatric: He has a normal  mood and affect. His speech is normal and behavior is normal. Judgment and thought content normal. Cognition and memory are normal.    Results for orders placed or performed in visit on 12/28/14  Comprehensive metabolic panel  Result Value Ref Range   Glucose 94 65 - 99 mg/dL   BUN 19 8 - 27 mg/dL   Creatinine, Ser 1.16 0.76 - 1.27 mg/dL   GFR calc non Af Amer 66 >59 mL/min/1.73   GFR calc Af Amer 77 >59 mL/min/1.73   BUN/Creatinine Ratio 16 10 - 22   Sodium 142 134 - 144 mmol/L   Potassium 4.6 3.5 - 5.2 mmol/L   Chloride 103 97 - 108 mmol/L   CO2 22 18 - 29 mmol/L   Calcium 9.4 8.6 - 10.2 mg/dL   Total Protein 6.6 6.0 - 8.5 g/dL   Albumin 4.6 3.6 - 4.8 g/dL   Globulin, Total 2.0 1.5 - 4.5 g/dL   Albumin/Globulin Ratio 2.3 1.1 - 2.5   Bilirubin Total 0.4 0.0 - 1.2 mg/dL   Alkaline Phosphatase 81 39 - 117 IU/L   AST 41 (H) 0 - 40 IU/L   ALT 44 0 - 44 IU/L  CBC with Differential/Platelet  Result Value Ref Range   WBC 7.2 3.4 - 10.8 x10E3/uL   RBC 4.69 4.14 - 5.80 x10E6/uL   Hemoglobin 14.2 12.6 - 17.7 g/dL   Hematocrit 40.9 37.5 - 51.0 %   MCV 87  79 - 97 fL   MCH 30.3 26.6 - 33.0 pg   MCHC 34.7 31.5 - 35.7 g/dL   RDW 13.2 12.3 - 15.4 %   Platelets 324 150 - 379 x10E3/uL   Neutrophils 63 %   Lymphs 22 %   Monocytes 8 %   Eos 6 %   Basos 1 %   Neutrophils Absolute 4.5 1.4 - 7.0 x10E3/uL   Lymphocytes Absolute 1.6 0.7 - 3.1 x10E3/uL   Monocytes Absolute 0.6 0.1 - 0.9 x10E3/uL   EOS (ABSOLUTE) 0.4 0.0 - 0.4 x10E3/uL   Basophils Absolute 0.0 0.0 - 0.2 x10E3/uL   Immature Granulocytes 0 %   Immature Grans (Abs) 0.0 0.0 - 0.1 x10E3/uL  TSH  Result Value Ref Range   TSH 1.880 0.450 - 4.500 uIU/mL      Assessment & Plan:   Problem List Items Addressed This Visit      Other   Depression - Primary    Discuss inadequate treatment with adequate time trial and dosing titration will change to Effexor XR Discontinue fluoxetine Discussed strategies for sleep at night       Relevant Medications   venlafaxine XR (EFFEXOR XR) 75 MG 24 hr capsule       Follow up plan: Return in about 4 weeks (around 03/08/2015) for Recheck medicine change.

## 2015-02-08 NOTE — Assessment & Plan Note (Signed)
Discuss inadequate treatment with adequate time trial and dosing titration will change to Effexor XR Discontinue fluoxetine Discussed strategies for sleep at night

## 2015-02-18 ENCOUNTER — Encounter: Payer: Self-pay | Admitting: Emergency Medicine

## 2015-02-18 ENCOUNTER — Emergency Department: Payer: 59

## 2015-02-18 ENCOUNTER — Emergency Department
Admission: EM | Admit: 2015-02-18 | Discharge: 2015-02-18 | Disposition: A | Discharge status: Home / Self Care | Payer: 59 | Attending: Emergency Medicine | Admitting: Emergency Medicine

## 2015-02-18 DIAGNOSIS — R1031 Right lower quadrant pain: Secondary | ICD-10-CM

## 2015-02-18 DIAGNOSIS — Z79899 Other long term (current) drug therapy: Secondary | ICD-10-CM | POA: Insufficient documentation

## 2015-02-18 DIAGNOSIS — Z7982 Long term (current) use of aspirin: Secondary | ICD-10-CM | POA: Insufficient documentation

## 2015-02-18 DIAGNOSIS — N200 Calculus of kidney: Secondary | ICD-10-CM | POA: Diagnosis not present

## 2015-02-18 LAB — COMPREHENSIVE METABOLIC PANEL
ALBUMIN: 4.3 g/dL (ref 3.5–5.0)
ALK PHOS: 71 U/L (ref 38–126)
ALT: 37 U/L (ref 17–63)
AST: 29 U/L (ref 15–41)
Anion gap: 8 (ref 5–15)
BILIRUBIN TOTAL: 0.4 mg/dL (ref 0.3–1.2)
BUN: 19 mg/dL (ref 6–20)
CO2: 23 mmol/L (ref 22–32)
Calcium: 10.1 mg/dL (ref 8.9–10.3)
Chloride: 108 mmol/L (ref 101–111)
Creatinine, Ser: 1.21 mg/dL (ref 0.61–1.24)
GFR calc Af Amer: >60 mL/min (ref >60)
GFR calc non Af Amer: >60 mL/min (ref >60)
GLUCOSE: 126 mg/dL — AB (ref 65–99)
Potassium: 4.2 mmol/L (ref 3.5–5.1)
SODIUM: 139 mmol/L (ref 135–145)
Total Protein: 7.3 g/dL (ref 6.5–8.1)

## 2015-02-18 LAB — CBC WITH DIFFERENTIAL/PLATELET
Basophils Absolute: 0.2 10*3/uL — ABNORMAL HIGH (ref 0–0.1)
Basophils Relative: 2 %
EOS PCT: 4 %
Eosinophils Absolute: 0.4 10*3/uL (ref 0–0.7)
HCT: 43.3 % (ref 40.0–52.0)
Hemoglobin: 14.4 g/dL (ref 13.0–18.0)
LYMPHS ABS: 1.4 10*3/uL (ref 1.0–3.6)
LYMPHS PCT: 14 %
MCH: 30.2 pg (ref 26.0–34.0)
MCHC: 33.3 g/dL (ref 32.0–36.0)
MCV: 90.5 fL (ref 80.0–100.0)
MONO ABS: 0.7 10*3/uL (ref 0.2–1.0)
Monocytes Relative: 7 %
Neutro Abs: 7.4 10*3/uL — ABNORMAL HIGH (ref 1.4–6.5)
Neutrophils Relative %: 73 %
PLATELETS: 301 10*3/uL (ref 150–440)
RBC: 4.79 MIL/uL (ref 4.40–5.90)
RDW: 12.8 % (ref 11.5–14.5)
WBC: 10.1 10*3/uL (ref 3.8–10.6)

## 2015-02-18 LAB — URINALYSIS COMPLETE WITH MICROSCOPIC (ARMC ONLY)
BACTERIA UA: NONE SEEN
BILIRUBIN URINE: NEGATIVE
Glucose, UA: NEGATIVE mg/dL
Ketones, ur: NEGATIVE mg/dL
Leukocytes, UA: NEGATIVE
Nitrite: NEGATIVE
PH: 5 (ref 5.0–8.0)
PROTEIN: NEGATIVE mg/dL
SQUAMOUS EPITHELIAL / LPF: NONE SEEN
Specific Gravity, Urine: 1.017 (ref 1.005–1.030)

## 2015-02-18 MED ORDER — TAMSULOSIN HCL 0.4 MG PO CAPS: 0.4000 mg | ORAL_CAPSULE | Freq: Every day | ORAL | Status: DC

## 2015-02-18 MED ORDER — TAMSULOSIN HCL 0.4 MG PO CAPS
0.4000 mg | ORAL_CAPSULE | Freq: Once | ORAL | Status: AC
  Administered 2015-02-18: 0.4 mg via ORAL
  Filled 2015-02-18: qty 1

## 2015-02-18 MED ORDER — OXYCODONE-ACETAMINOPHEN 5-325 MG PO TABS: 1.0000 | ORAL_TABLET | Freq: Four times a day (QID) | ORAL | Status: DC | PRN

## 2015-02-18 NOTE — Discharge Instructions (Signed)
Kidney Stones  Kidney stones (urolithiasis) are solid masses that form inside your kidneys. The intense pain is caused by the stone moving through the kidney, ureter, bladder, and urethra (urinary tract). When the stone moves, the ureter starts to spasm around the stone. The stone is usually passed in your pee (urine).   HOME CARE  · Drink enough fluids to keep your pee clear or pale yellow. This helps to get the stone out.  · Take a 24-hour pee (urine) sample as told by your doctor. You may need to take another sample every 6-12 months.  · Strain all pee through the provided strainer. Do not pee without peeing through the strainer, not even once. If you pee the stone out, catch it in the strainer. The stone may be as small as a grain of salt. Take this to your doctor. This will help your doctor figure out what you can do to try to prevent more kidney stones.  · Only take medicine as told by your doctor.  · Make changes to your daily diet as told by your doctor. You may be told to:    Limit how much salt you eat.    Eat 5 or more servings of fruits and vegetables each day.    Limit how much meat, poultry, fish, and eggs you eat.  · Keep all follow-up visits as told by your doctor. This is important.  · Get follow-up X-rays as told by your doctor.  GET HELP IF:  You have pain that gets worse even if you have been taking pain medicine.  GET HELP RIGHT AWAY IF:   · Your pain does not get better with medicine.  · You have a fever or shaking chills.  · Your pain increases and gets worse over 18 hours.  · You have new belly (abdominal) pain.  · You feel faint or pass out.  · You are unable to pee.     This information is not intended to replace advice given to you by your health care provider. Make sure you discuss any questions you have with your health care provider.     Document Released: 09/03/2007 Document Revised: 12/06/2014 Document Reviewed: 08/18/2012  Elsevier Interactive Patient Education ©2016 [employer]

## 2015-02-18 NOTE — ED Provider Notes (Signed)
[facility] Emergency Department Provider Note  ____________________________________________  Time seen: Approximately 240 PM  I have reviewed the triage vital signs and the nursing notes.   HISTORY  Chief Complaint Abdominal Pain    HPI Devin Adams is a 64 y.o. male with a history of kidney stones and appendectomy who is presenting today with right lower quadrant abdominal pain. He said that he has had 2 episodes today of this pain one from about 10-11 and the other for about 1:59 PM. He said that it was severe and attentive 10. He describes the pain as sharp and in his right lower quadrant and testicles. He denies any radiation and was specifically asked about any radiating pain to the back which she denied. Denies any difficulty urinating including burning or blood in the urine. Denies any nausea vomiting or diarrhea. Denies pushing hard to move his stool. He says that he did feel a hemorrhoid when moving his bowels this morning and pushed it back in. He said that he has had a mild amount of blood on the outside of his stools over the past several months. Denies any black or maroon stools at this time.Says that he also had a large dinner last night he needed to have Pepto-Bismol afterwards because of feeling of bloating and also belching.   Past Medical History  Diagnosis Date  . Hyperlipidemia   . Insomnia, unspecified   . Sleep apnea   . Other testicular hypofunction   . Enlarged prostate   . Kidney stone     Patient Active Problem List   Diagnosis Date Noted  . Depression 12/28/2014  . Sleep apnea 12/28/2014  . Fatigue 12/28/2014  . Elevated fasting glucose 05/19/2014  . Obesity 05/03/2013  . Hyperlipidemia 03/13/2011  . SOB (shortness of breath) 03/13/2011  . Visit for preventive health examination 03/13/2011    Past Surgical History  Procedure Laterality Date  . Appendectomy    . Esophageal dilation    . Thyroid cyst excision    .  Laser of prostate w/ green light pvp      Current Outpatient Rx  Name  Route  Sig  Dispense  Refill  . aspirin 81 MG tablet   Oral   Take 81 mg by mouth daily.           Marland Kitchen CIALIS 5 MG tablet   Oral   Take 5 mg by mouth daily.           Dispense as written.   . gabapentin (NEURONTIN) 300 MG capsule      Take two tablets at bedtime.         . lovastatin (MEVACOR) 40 MG tablet      Take 1 tablet by mouth at  bedtime   90 tablet   2   . mometasone (NASONEX) 50 MCG/ACT nasal spray   Nasal   Place 2 sprays into the nose daily.           Marland Kitchen venlafaxine XR (EFFEXOR XR) 75 MG 24 hr capsule   Oral   Take 1 capsule (75 mg total) by mouth daily with breakfast.   30 capsule   1     Allergies Review of patient's allergies indicates no known allergies.  Family History  Problem Relation Age of Onset  . Heart failure      CHF-family hx    Social History Social History  Substance Use Topics  . Smoking status: Never Smoker   .  Smokeless tobacco: Never Used  . Alcohol Use: Yes     Comment: Occasionally    Review of Systems Constitutional: No fever/chills Eyes: No visual changes. ENT: No sore throat. Cardiovascular: Denies chest pain. Respiratory: Denies shortness of breath. Gastrointestinal: No abdominal pain.  No nausea, no vomiting.  No diarrhea.  No constipation. Genitourinary: Negative for dysuria. Musculoskeletal: Negative for back pain. Skin: Negative for rash. Neurological: Negative for headaches, focal weakness or numbness.  10-point ROS otherwise negative.  ____________________________________________   PHYSICAL EXAM:  VITAL SIGNS: ED Triage Vitals  Enc Vitals Group     BP 02/18/15 1403 164/83 mmHg     Pulse Rate 02/18/15 1403 68     Resp 02/18/15 1403 20     Temp 02/18/15 1403 97.4 F (36.3 C)     Temp Source 02/18/15 1403 Oral     SpO2 02/18/15 1403 96 %     Weight 02/18/15 1403 220 lb (99.791 kg)     Height 02/18/15 1403 5\' 11"   (1.803 m)     Head Cir --      Peak Flow --      Pain Score 02/18/15 1403 7     Pain Loc --      Pain Edu? --      Excl. in [place]? --     Constitutional: Alert and oriented. Well appearing and in no acute distress. Eyes: Conjunctivae are normal. PERRL. EOMI. Head: Atraumatic. Nose: No congestion/rhinnorhea. Mouth/Throat: Mucous membranes are moist.   Neck: No stridor.   Cardiovascular: Normal rate, regular rhythm. Grossly normal heart sounds.  Good peripheral circulation. Intact and equal dorsalis pedis pulses to the bilateral feet. Respiratory: Normal respiratory effort.  No retractions. Lungs CTAB. Gastrointestinal: Soft and nontender. No distention. No abdominal bruits. No CVA tenderness. Normal external genital exam. No masses or tenderness palpated to the penis or testicles.  Rectal exam with small non-engorged hemorrhoid at 12:00. Musculoskeletal: No lower extremity tenderness nor edema.  No joint effusions. Neurologic:  Normal speech and language. No gross focal neurologic deficits are appreciated. No gait instability. Skin:  Skin is warm, dry and intact. No rash noted. Psychiatric: Mood and affect are normal. Speech and behavior are normal.  ____________________________________________   LABS (all labs ordered are listed, but only abnormal results are displayed)  Labs Reviewed  COMPREHENSIVE METABOLIC PANEL - Abnormal; Notable for the following:    Glucose, Bld 126 (*)    All other components within normal limits  CBC WITH DIFFERENTIAL/PLATELET - Abnormal; Notable for the following:    Neutro Abs 7.4 (*)    Basophils Absolute 0.2 (*)    All other components within normal limits  URINALYSIS COMPLETEWITH MICROSCOPIC ([facility] ONLY) - Abnormal; Notable for the following:    Color, Urine YELLOW (*)    APPearance CLEAR (*)    Hgb urine dipstick 3+ (*)    All other components within normal limits    ____________________________________________  EKG   ____________________________________________  RADIOLOGY  Mild right hydronephrosis. 4 mm stone at the UVJ with. Unclear if it is our he passed in the bladder. Cholelithiasis without evidence of cholecystitis. ____________________________________________   PROCEDURES   ____________________________________________   INITIAL IMPRESSION / ASSESSMENT AND PLAN / ED COURSE  Pertinent labs & imaging results that were available during my care of the patient were reviewed by me and considered in my medical decision making (see chart for details).  ----------------------------------------- 4:08 PM on 02/18/2015 -----------------------------------------  Patient continues to rest comfortable with this time  and continues not require any pain medicine. I feel that clinically it is likely that the stone is our he passed in the bladder. I discussed the CAT scan as well as the lab and urine findings with the patient as well as his wife. He will be discharged home on Flomax. He will also be given a strainer and Percocet for any breakthrough pain while continuing to pass the stone. He has a urologist, Dr. Rogers Blocker, he will follow-up with him in the office. ____________________________________________   FINAL CLINICAL IMPRESSION(S) / ED DIAGNOSES  Kidney stone.    Orbie Pyo, MD 02/18/15 [phone]
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
## 2015-02-18 NOTE — ED Notes (Signed)
Pt presents with right lower quadrant pain started this am, went away for about an hour and then came back. Denies any n/v/d.

## 2015-03-08 ENCOUNTER — Encounter: Payer: Self-pay | Admitting: Family Medicine

## 2015-03-08 ENCOUNTER — Ambulatory Visit (INDEPENDENT_AMBULATORY_CARE_PROVIDER_SITE_OTHER): Payer: 59 | Admitting: Family Medicine

## 2015-03-08 ENCOUNTER — Other Ambulatory Visit: Payer: Self-pay | Admitting: Family Medicine

## 2015-03-08 VITALS — BP 123/78 | HR 76 | Temp 98.0°F | Ht 69.7 in | Wt 224.0 lb

## 2015-03-08 DIAGNOSIS — F329 Major depressive disorder, single episode, unspecified: Secondary | ICD-10-CM | POA: Diagnosis not present

## 2015-03-08 DIAGNOSIS — F32A Depression, unspecified: Secondary | ICD-10-CM

## 2015-03-08 MED ORDER — VENLAFAXINE HCL ER 75 MG PO CP24: 75.0000 mg | ORAL_CAPSULE | Freq: Every day | ORAL | Status: DC

## 2015-03-08 NOTE — Assessment & Plan Note (Signed)
Patient recovering well taking medications well will give refills and discussed long-term nature of treatment

## 2015-03-08 NOTE — Progress Notes (Signed)
`  BP 123/78 mmHg  Pulse 76  Temp(Src) 98 F (36.7 C)  Ht 5' 9.7" (1.77 m)  Wt 224 lb (101.606 kg)  BMI 32.43 kg/m2  SpO2 96%   Subjective:    Patient ID: Devin Adams, male    DOB: 07-30-1950, 64 y.o.   MRN: [id number]  HPI: Devin Adams is a 64 y.o. male  Chief Complaint  Patient presents with  . Depression   patient reports 80-90% improved took about 2 weeks to turn the corner and started improving. Takes medications with no side effects Energy is back and especially his sleeping all night without problems Wife has reported improvement  Relevant past medical, surgical, family and social history reviewed and updated as indicated. Interim medical history since our last visit reviewed. Allergies and medications reviewed and updated.  Review of Systems  Constitutional: Negative.   Respiratory: Negative.   Cardiovascular: Negative.     Per HPI unless specifically indicated above     Objective:    BP 123/78 mmHg  Pulse 76  Temp(Src) 98 F (36.7 C)  Ht 5' 9.7" (1.77 m)  Wt 224 lb (101.606 kg)  BMI 32.43 kg/m2  SpO2 96%  Wt Readings from Last 3 Encounters:  03/08/15 224 lb (101.606 kg)  02/18/15 220 lb (99.791 kg)  02/08/15 221 lb (100.245 kg)    Physical Exam  Constitutional: He is oriented to person, place, and time. He appears well-developed and well-nourished. No distress.  HENT:  Head: Normocephalic and atraumatic.  Right Ear: Hearing normal.  Left Ear: Hearing normal.  Nose: Nose normal.  Eyes: Conjunctivae and lids are normal. Right eye exhibits no discharge. Left eye exhibits no discharge. No scleral icterus.  Pulmonary/Chest: Effort normal. No respiratory distress.  Musculoskeletal: Normal range of motion.  Neurological: He is alert and oriented to person, place, and time.  Skin: Skin is intact. No rash noted.  Psychiatric: He has a normal mood and affect. His speech is normal and behavior is normal. Judgment and thought content normal.  Cognition and memory are normal.    Results for orders placed or performed during the hospital encounter of 02/18/15  Comprehensive metabolic panel  Result Value Ref Range   Sodium 139 135 - 145 mmol/L   Potassium 4.2 3.5 - 5.1 mmol/L   Chloride 108 101 - 111 mmol/L   CO2 23 22 - 32 mmol/L   Glucose, Bld 126 (H) 65 - 99 mg/dL   BUN 19 6 - 20 mg/dL   Creatinine, Ser 1.21 0.61 - 1.24 mg/dL   Calcium 10.1 8.9 - 10.3 mg/dL   Total Protein 7.3 6.5 - 8.1 g/dL   Albumin 4.3 3.5 - 5.0 g/dL   AST 29 15 - 41 U/L   ALT 37 17 - 63 U/L   Alkaline Phosphatase 71 38 - 126 U/L   Total Bilirubin 0.4 0.3 - 1.2 mg/dL   GFR calc non Af Amer >60 >60 mL/min   GFR calc Af Amer >60 >60 mL/min   Anion gap 8 5 - 15  CBC WITH DIFFERENTIAL  Result Value Ref Range   WBC 10.1 3.8 - 10.6 K/uL   RBC 4.79 4.40 - 5.90 MIL/uL   Hemoglobin 14.4 13.0 - 18.0 g/dL   HCT 43.3 40.0 - 52.0 %   MCV 90.5 80.0 - 100.0 fL   MCH 30.2 26.0 - 34.0 pg   MCHC 33.3 32.0 - 36.0 g/dL   RDW 12.8 11.5 - 14.5 %   Platelets  301 150 - 440 K/uL   Neutrophils Relative % 73 %   Neutro Abs 7.4 (H) 1.4 - 6.5 K/uL   Lymphocytes Relative 14 %   Lymphs Abs 1.4 1.0 - 3.6 K/uL   Monocytes Relative 7 %   Monocytes Absolute 0.7 0.2 - 1.0 K/uL   Eosinophils Relative 4 %   Eosinophils Absolute 0.4 0 - 0.7 K/uL   Basophils Relative 2 %   Basophils Absolute 0.2 (H) 0 - 0.1 K/uL  Urinalysis complete, with microscopic ([facility] only)  Result Value Ref Range   Color, Urine YELLOW (A) YELLOW   APPearance CLEAR (A) CLEAR   Glucose, UA NEGATIVE NEGATIVE mg/dL   Bilirubin Urine NEGATIVE NEGATIVE   Ketones, ur NEGATIVE NEGATIVE mg/dL   Specific Gravity, Urine 1.017 1.005 - 1.030   Hgb urine dipstick 3+ (A) NEGATIVE   pH 5.0 5.0 - 8.0   Protein, ur NEGATIVE NEGATIVE mg/dL   Nitrite NEGATIVE NEGATIVE   Leukocytes, UA NEGATIVE NEGATIVE   RBC / HPF TOO NUMEROUS TO COUNT 0 - 5 RBC/hpf   WBC, UA 0-5 0 - 5 WBC/hpf   Bacteria, UA NONE SEEN NONE SEEN    Squamous Epithelial / LPF NONE SEEN NONE SEEN   Mucous PRESENT       Assessment & Plan:   Problem List Items Addressed This Visit      Other   Depression - Primary    Patient recovering well taking medications well will give refills and discussed long-term nature of treatment      Relevant Medications   venlafaxine XR (EFFEXOR XR) 75 MG 24 hr capsule       Follow up plan: Return in about 3 months (around 06/06/2015).

## 2015-03-21 ENCOUNTER — Telehealth: Payer: Self-pay | Admitting: Family Medicine

## 2015-03-21 MED ORDER — GABAPENTIN 300 MG PO CAPS: 600.0000 mg | ORAL_CAPSULE | Freq: Every day | ORAL | Status: DC

## 2015-03-21 NOTE — Telephone Encounter (Signed)
Pt would like a refill for gabapentin sent to [facility]

## 2015-05-23 ENCOUNTER — Ambulatory Visit: Payer: 59 | Admitting: Cardiovascular Disease

## 2015-05-28 ENCOUNTER — Encounter: Payer: Self-pay | Admitting: Family Medicine

## 2015-05-28 ENCOUNTER — Ambulatory Visit (INDEPENDENT_AMBULATORY_CARE_PROVIDER_SITE_OTHER): Payer: 59 | Admitting: Family Medicine

## 2015-05-28 VITALS — BP 134/76 | HR 57 | Temp 97.8°F | Ht 68.6 in | Wt 224.0 lb

## 2015-05-28 DIAGNOSIS — Z113 Encounter for screening for infections with a predominantly sexual mode of transmission: Secondary | ICD-10-CM

## 2015-05-28 DIAGNOSIS — G4730 Sleep apnea, unspecified: Secondary | ICD-10-CM | POA: Diagnosis not present

## 2015-05-28 DIAGNOSIS — F329 Major depressive disorder, single episode, unspecified: Secondary | ICD-10-CM | POA: Diagnosis not present

## 2015-05-28 DIAGNOSIS — E785 Hyperlipidemia, unspecified: Secondary | ICD-10-CM | POA: Diagnosis not present

## 2015-05-28 DIAGNOSIS — Z0000 Encounter for general adult medical examination without abnormal findings: Secondary | ICD-10-CM | POA: Diagnosis not present

## 2015-05-28 DIAGNOSIS — Z23 Encounter for immunization: Secondary | ICD-10-CM

## 2015-05-28 DIAGNOSIS — F32A Depression, unspecified: Secondary | ICD-10-CM

## 2015-05-28 LAB — URINALYSIS, ROUTINE W REFLEX MICROSCOPIC
BILIRUBIN UA: NEGATIVE
Glucose, UA: NEGATIVE
KETONES UA: NEGATIVE
Leukocytes, UA: NEGATIVE
NITRITE UA: NEGATIVE
Protein, UA: NEGATIVE
UUROB: 0.2 mg/dL (ref 0.2–1.0)
pH, UA: 5 (ref 5.0–7.5)

## 2015-05-28 LAB — MICROSCOPIC EXAMINATION

## 2015-05-28 MED ORDER — GABAPENTIN 300 MG PO CAPS: 600.0000 mg | ORAL_CAPSULE | Freq: Every day | ORAL | Status: DC

## 2015-05-28 MED ORDER — VENLAFAXINE HCL ER 37.5 MG PO CP24: 37.5000 mg | ORAL_CAPSULE | Freq: Every day | ORAL | Status: DC

## 2015-05-28 MED ORDER — TAMSULOSIN HCL 0.4 MG PO CAPS: 0.4000 mg | ORAL_CAPSULE | Freq: Every day | ORAL | Status: DC

## 2015-05-28 MED ORDER — LOVASTATIN 40 MG PO TABS: ORAL_TABLET | ORAL | Status: DC

## 2015-05-28 NOTE — Assessment & Plan Note (Signed)
Patient follow-up depression having side effects to Effexor with vivid dreams will discontinue Effexor by tapering to 37.5 for 2 weeks then discontinuing. Discussed depression and stress in patient's life which has resolved will do a trial with no medication instead of switching. Patient will contact  if problems
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
## 2015-05-28 NOTE — Assessment & Plan Note (Signed)
The current medical regimen is effective;  continue present plan and medications.  

## 2015-05-29 ENCOUNTER — Telehealth: Payer: Self-pay | Admitting: Family Medicine

## 2015-05-29 LAB — TSH: TSH: 2.22 u[IU]/mL (ref 0.450–4.500)

## 2015-05-29 LAB — CBC WITH DIFFERENTIAL/PLATELET
BASOS ABS: 0 10*3/uL (ref 0.0–0.2)
BASOS: 1 %
EOS (ABSOLUTE): 0.6 10*3/uL — AB (ref 0.0–0.4)
Eos: 8 %
HEMATOCRIT: 43.2 % (ref 37.5–51.0)
Hemoglobin: 15 g/dL (ref 12.6–17.7)
IMMATURE GRANULOCYTES: 0 %
Immature Grans (Abs): 0 10*3/uL (ref 0.0–0.1)
LYMPHS ABS: 1.5 10*3/uL (ref 0.7–3.1)
Lymphs: 18 %
MCH: 30.5 pg (ref 26.6–33.0)
MCHC: 34.7 g/dL (ref 31.5–35.7)
MCV: 88 fL (ref 79–97)
MONOS ABS: 0.8 10*3/uL (ref 0.1–0.9)
Monocytes: 10 %
NEUTROS PCT: 63 %
Neutrophils Absolute: 5.2 10*3/uL (ref 1.4–7.0)
PLATELETS: 321 10*3/uL (ref 150–379)
RBC: 4.91 x10E6/uL (ref 4.14–5.80)
RDW: 13.3 % (ref 12.3–15.4)
WBC: 8.2 10*3/uL (ref 3.4–10.8)

## 2015-05-29 LAB — PSA: PROSTATE SPECIFIC AG, SERUM: 0.9 ng/mL (ref 0.0–4.0)

## 2015-05-29 LAB — COMPREHENSIVE METABOLIC PANEL
ALBUMIN: 4.7 g/dL (ref 3.6–4.8)
ALT: 46 IU/L — AB (ref 0–44)
AST: 32 IU/L (ref 0–40)
Albumin/Globulin Ratio: 2 (ref 1.1–2.5)
Alkaline Phosphatase: 88 IU/L (ref 39–117)
BUN / CREAT RATIO: 14 (ref 10–22)
BUN: 15 mg/dL (ref 8–27)
Bilirubin Total: 0.3 mg/dL (ref 0.0–1.2)
CALCIUM: 9.8 mg/dL (ref 8.6–10.2)
CO2: 23 mmol/L (ref 18–29)
CREATININE: 1.05 mg/dL (ref 0.76–1.27)
Chloride: 102 mmol/L (ref 96–106)
GFR calc non Af Amer: 74 mL/min/{1.73_m2} (ref >59)
GFR, EST AFRICAN AMERICAN: 86 mL/min/{1.73_m2} (ref >59)
GLUCOSE: 101 mg/dL — AB (ref 65–99)
Globulin, Total: 2.3 g/dL (ref 1.5–4.5)
Potassium: 5 mmol/L (ref 3.5–5.2)
Sodium: 141 mmol/L (ref 134–144)
TOTAL PROTEIN: 7 g/dL (ref 6.0–8.5)

## 2015-05-29 LAB — LIPID PANEL
Chol/HDL Ratio: 3.6 ratio units (ref 0.0–5.0)
Cholesterol, Total: 192 mg/dL (ref 100–199)
HDL: 53 mg/dL (ref >39)
LDL CALC: 112 mg/dL — AB (ref 0–99)
Triglycerides: 135 mg/dL (ref 0–149)
VLDL CHOLESTEROL CAL: 27 mg/dL (ref 5–40)

## 2015-05-29 LAB — HEPATITIS C ANTIBODY: Hep C Virus Ab: <0.1 s/co ratio (ref 0.0–0.9)

## 2015-05-29 NOTE — Telephone Encounter (Signed)
-----   Message from [place] sent at 05/29/2015  5:02 PM EST ----- labs

## 2015-05-29 NOTE — Telephone Encounter (Signed)
Phone call Discussed with patient slight elevation liver enzymes will observe patient not drinking or taking extra Tylenol will recheck ALT AST next office visit

## 2015-07-06 ENCOUNTER — Encounter: Payer: Self-pay | Admitting: Cardiovascular Disease

## 2015-07-06 ENCOUNTER — Ambulatory Visit (INDEPENDENT_AMBULATORY_CARE_PROVIDER_SITE_OTHER): Payer: 59 | Admitting: Cardiovascular Disease

## 2015-07-06 VITALS — BP 130/74 | HR 106 | Ht 71.0 in | Wt 226.0 lb

## 2015-07-06 DIAGNOSIS — R7301 Impaired fasting glucose: Secondary | ICD-10-CM | POA: Diagnosis not present

## 2015-07-06 DIAGNOSIS — E669 Obesity, unspecified: Secondary | ICD-10-CM

## 2015-07-06 DIAGNOSIS — R0602 Shortness of breath: Secondary | ICD-10-CM | POA: Diagnosis not present

## 2015-07-06 DIAGNOSIS — E785 Hyperlipidemia, unspecified: Secondary | ICD-10-CM

## 2015-07-06 NOTE — Progress Notes (Signed)
Patient ID: Devin Adams, male    DOB: 10-Nov-1950, 65 y.o.   MRN: [id number]  HPI Comments: Devin Adams is a  65 -year-old gentleman, patient of Dr. Jeananne Rama, with a history of hyperlipidemia,  Sleep apnea, low testosterone, previous history of shortness of breath with heavy exertion with previous stress test in 2008 who presents for routine followup of his hyperlipidemia  In follow-up, he has been active, denies having any significant symptoms of chest discomfort Recent shortness of breath with heavy exertion, as been working on his garden, shoveling heavy soil. Weight is elevated but stable Lab work reviewed with him showing total cholesterol 190, LDL 112. Continues to have glucose 100 or higher No regular exercise program  CT scan images reviewed with him that he had for kidney stone This shows no significant PAD extending from descending aorta thoracic region down to his femoral arteries. Distal RCA with no coronary calcification  EKG shows normal sinus rhythm with rate 101 bpm, no significant ST or T-wave changes  Previous total cholesterol 148, LDL 64, HDL 45  Other past medical history Notes indicate that his stress test 2008 was evaluated by Dr. Verl Blalock and Dr. Percival Spanish at [place] and it was not felt that he needed further workup such as a cardiac catheterization. The inferior wall was initially read as showing some reversible changes the evaluation of static images by the above doctors did not suggest ischemia. No cardiac catheterization was performed and he has done well since then.  No Known Allergies  Outpatient Encounter Prescriptions as of 07/06/2015  Medication Sig  . aspirin 81 MG tablet Take 81 mg by mouth daily.    Marland Kitchen CIALIS 5 MG tablet Take 5 mg by mouth daily.  Marland Kitchen gabapentin (NEURONTIN) 300 MG capsule Take 2 capsules (600 mg total) by mouth at bedtime. Take two tablets at bedtime.  . lovastatin (MEVACOR) 40 MG tablet Take 1 tablet by mouth at  bedtime  . mometasone  (NASONEX) 50 MCG/ACT nasal spray Place 2 sprays into the nose daily.    . tamsulosin (FLOMAX) 0.4 MG CAPS capsule Take 1 capsule (0.4 mg total) by mouth daily.  Marland Kitchen venlafaxine XR (EFFEXOR XR) 37.5 MG 24 hr capsule Take 1 capsule (37.5 mg total) by mouth daily with breakfast.   No facility-administered encounter medications on file as of 07/06/2015.    Past Medical History  Diagnosis Date  . Hyperlipidemia   . Insomnia, unspecified   . Sleep apnea   . Other testicular hypofunction   . Enlarged prostate   . Kidney stone   . History of kidney stones     Past Surgical History  Procedure Laterality Date  . Appendectomy    . Esophageal dilation    . Thyroid cyst excision    . Laser of prostate w/ green light pvp      Social History  reports that he has never smoked. He has never used smokeless tobacco. He reports that he drinks alcohol. He reports that he does not use illicit drugs.  Family History family history is not on file.  Review of Systems  Constitutional: Negative.   Respiratory: Negative.   Cardiovascular: Negative.   Gastrointestinal: Negative.   Musculoskeletal: Negative.   Neurological: Negative.   Hematological: Negative.   Psychiatric/Behavioral: Negative.   All other systems reviewed and are negative.   BP 130/74 mmHg  Pulse 106  Ht 5\' 11"  (1.803 m)  Wt 226 lb (102.513 kg)  BMI 31.53 kg/m2  SpO2 93%  Physical Exam  Constitutional: He is oriented to person, place, and time. He appears well-developed and well-nourished.  Obese  HENT:  Head: Normocephalic.  Nose: Nose normal.  Mouth/Throat: Oropharynx is clear and moist.  Eyes: Conjunctivae are normal. Pupils are equal, round, and reactive to light.  Neck: Normal range of motion. Neck supple. No JVD present.  Cardiovascular: Normal rate, regular rhythm, S1 normal, S2 normal, normal heart sounds and intact distal pulses.  Exam reveals no gallop and no friction rub.   No murmur heard. Pulmonary/Chest:  Effort normal and breath sounds normal. No respiratory distress. He has no wheezes. He has no rales. He exhibits no tenderness.  Abdominal: Soft. Bowel sounds are normal. He exhibits no distension. There is no tenderness.  Musculoskeletal: Normal range of motion. He exhibits no edema or tenderness.  Lymphadenopathy:    He has no cervical adenopathy.  Neurological: He is alert and oriented to person, place, and time. Coordination normal.  Skin: Skin is warm and dry. No rash noted. No erythema.  Psychiatric: He has a normal mood and affect. His behavior is normal. Judgment and thought content normal.      Assessment and Plan   Nursing note and vitals reviewed.

## 2015-07-06 NOTE — Assessment & Plan Note (Signed)
Recommended applying his diet to low carbohydrate foods, avoiding breads, sodas, suite tea, potatoes

## 2015-07-06 NOTE — Assessment & Plan Note (Signed)
We have encouraged continued exercise, careful diet management in an effort to lose weight. 

## 2015-07-06 NOTE — Assessment & Plan Note (Signed)
Recent CT scan reviewed showing no PAD No distal RCA coronary calcifications on CT Given this finding, recommended he continue on his lovastatin Recommended weight loss, low carbohydrate diet

## 2015-07-06 NOTE — Patient Instructions (Signed)
You are doing well. No medication changes were made.  Please call us if you have new issues that need to be addressed before your next appt.  Your physician wants you to follow-up in: 12 months.  You will receive a reminder letter in the mail two months in advance. If you don't receive a letter, please call our office to schedule the follow-up appointment. 

## 2015-07-25 ENCOUNTER — Telehealth: Payer: Self-pay | Admitting: Family Medicine

## 2015-07-25 MED ORDER — VENLAFAXINE HCL ER 75 MG PO CP24: 75.0000 mg | ORAL_CAPSULE | Freq: Every day | ORAL | Status: DC

## 2015-07-25 MED ORDER — VENLAFAXINE HCL ER 37.5 MG PO CP24: 37.5000 mg | ORAL_CAPSULE | Freq: Every day | ORAL | Status: DC

## 2015-07-25 NOTE — Telephone Encounter (Signed)
Pt called would like a call back about a medication refill for anxiety medication he was on does not know name of medication. Pt stated Dr. Jeananne Rama asked that the pt call if pt would like to go back on this medication. Please have Izora Gala call pt back ASAP. Thanks.

## 2015-07-25 NOTE — Telephone Encounter (Signed)
Spoke with patient, at February appointment  He wanted to stop his Effexor XR , he states you told him to call if he changed his mind.  He has changed his mind and would like to restart the medication.  [place]

## 2015-08-13 ENCOUNTER — Other Ambulatory Visit: Payer: Self-pay | Admitting: Family Medicine

## 2015-08-15 ENCOUNTER — Telehealth: Payer: Self-pay

## 2015-08-15 NOTE — Telephone Encounter (Signed)
Patient requesting Rx for Nasonex to be sent to [facility]  Sent 08/13/15 - escript confirmation

## 2015-08-24 ENCOUNTER — Other Ambulatory Visit: Payer: Self-pay | Admitting: Family Medicine

## 2015-12-04 ENCOUNTER — Ambulatory Visit: Payer: 59 | Admitting: Family Medicine

## 2015-12-11 ENCOUNTER — Telehealth: Payer: Self-pay

## 2015-12-11 MED ORDER — VENLAFAXINE HCL ER 75 MG PO CP24
75.0000 mg | ORAL_CAPSULE | Freq: Every day | ORAL | 1 refills | Status: DC
Start: 2015-12-11 — End: 2016-05-28

## 2015-12-11 NOTE — Telephone Encounter (Signed)
Patient's wife is requesting a 90 day Rx for  Venlafaxine XR 75mg  Cap  [facility] - less expensive

## 2016-01-12 IMAGING — CT CT RENAL STONE PROTOCOL
3 of 4 series · 9 of 46 positions shown, 16 images · non-contrast
Comparison: None.

CLINICAL DATA: Patient with right lower quadrant and flank pain.
History of stones.

EXAM:
CT ABDOMEN AND PELVIS WITHOUT CONTRAST
TECHNIQUE: Multidetector CT imaging of the abdomen and pelvis was performed
following the standard protocol without IV contrast.

[Series 4: lung · axial · 0.87mm/px · z∈[-165,-75]mm · 5 of 28 slices shown, 10 images]
[im 5/28  soft-tissue]
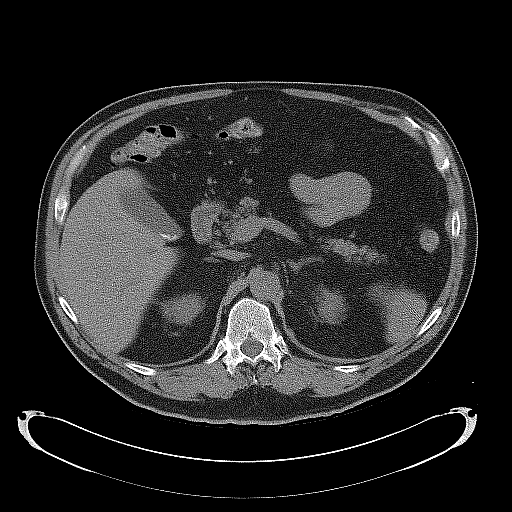
[im 5/28  bone]
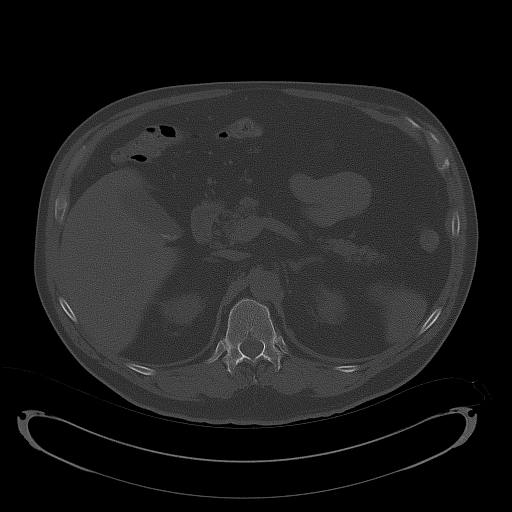
[im 10/28  soft-tissue]
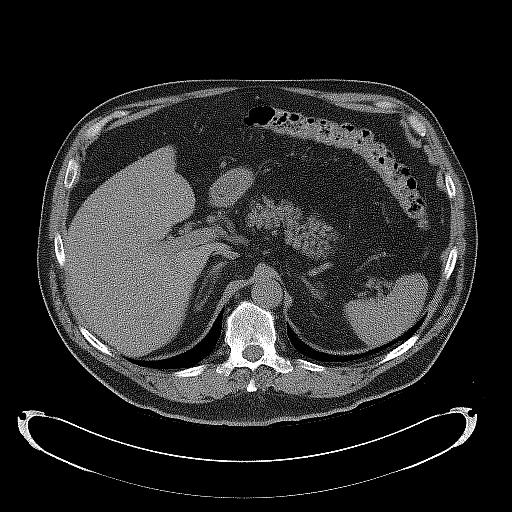
[im 10/28  lung]
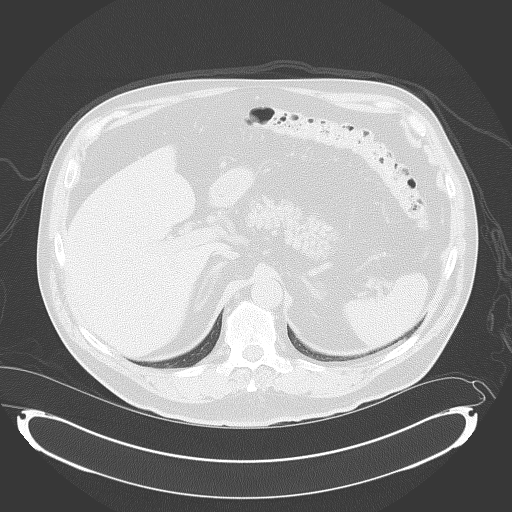
[im 14/28  soft-tissue]
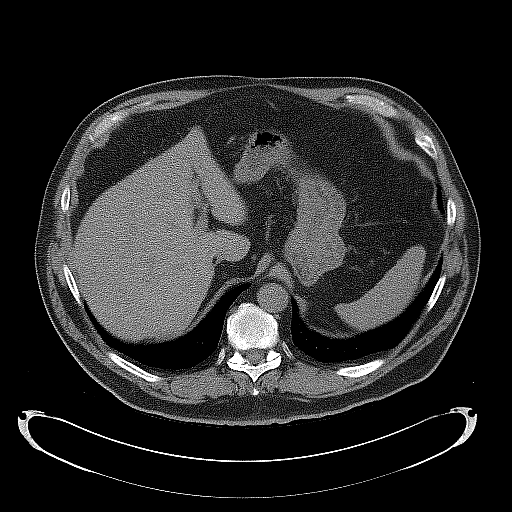
[im 14/28  lung]
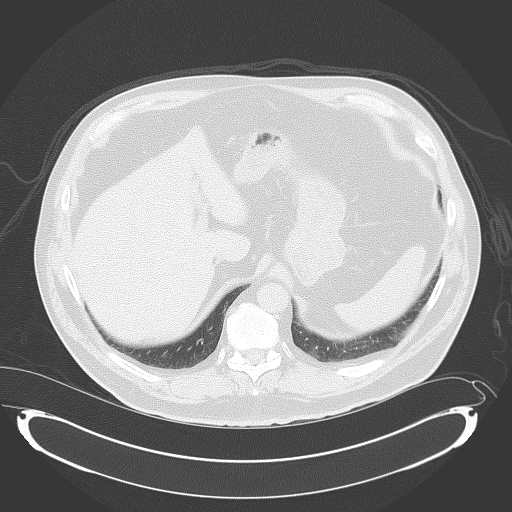
[im 19/28  soft-tissue]
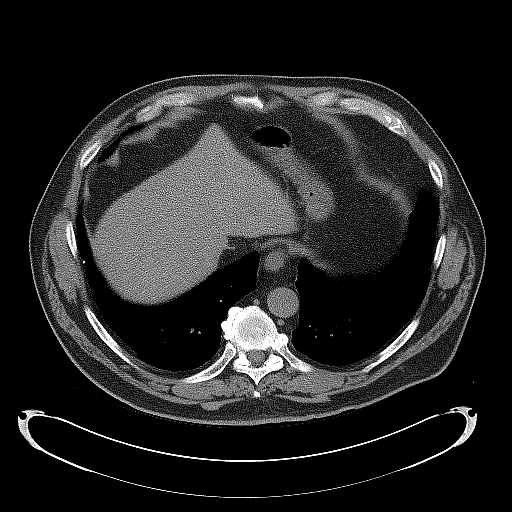
[im 19/28  lung]
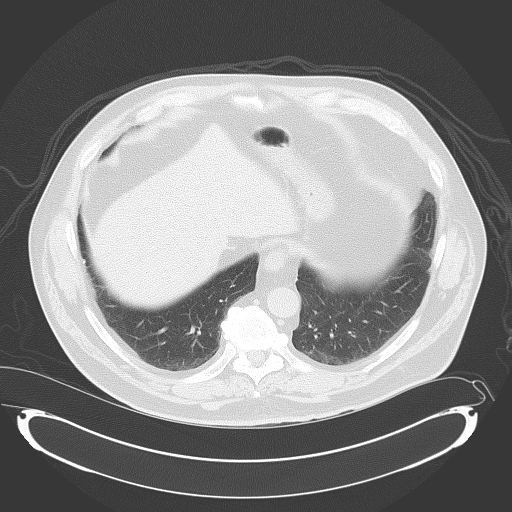
[im 23/28  soft-tissue]
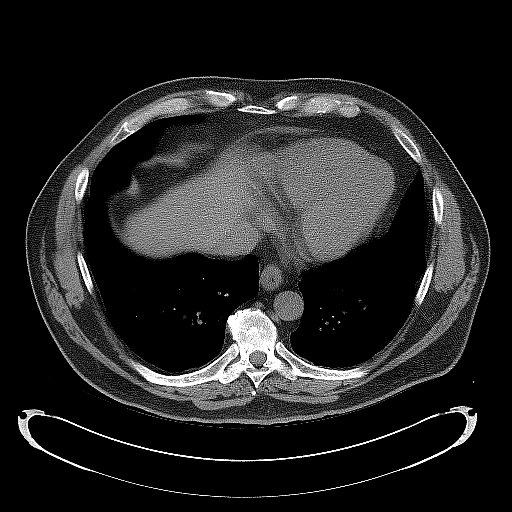
[im 23/28  lung]
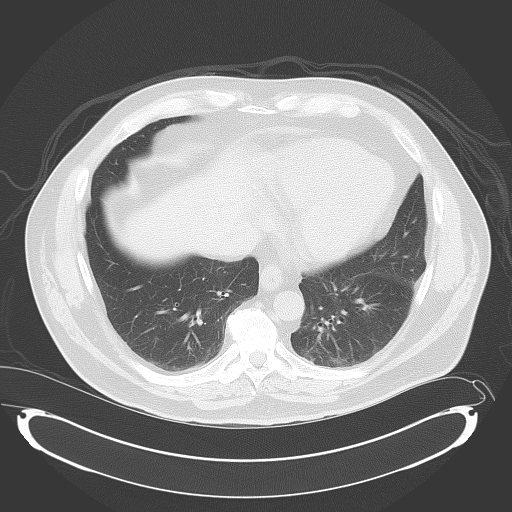

[Series 5: coronal · coronal · 0.79mm/px · 3 of 149 slices shown, 4 images]
[im 50/149  soft-tissue]
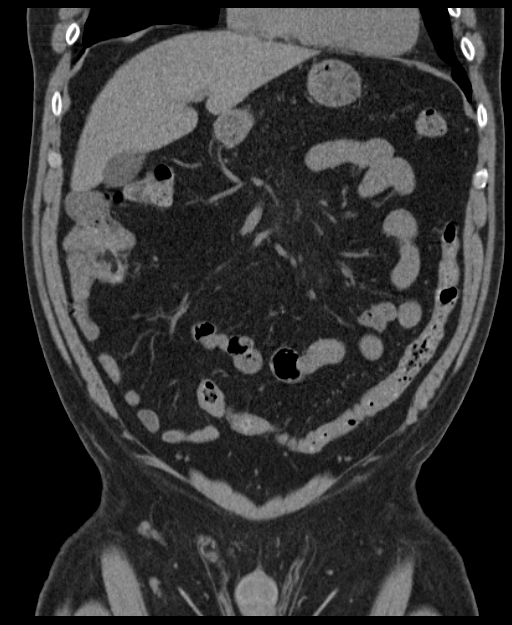
[im 66/149  soft-tissue]
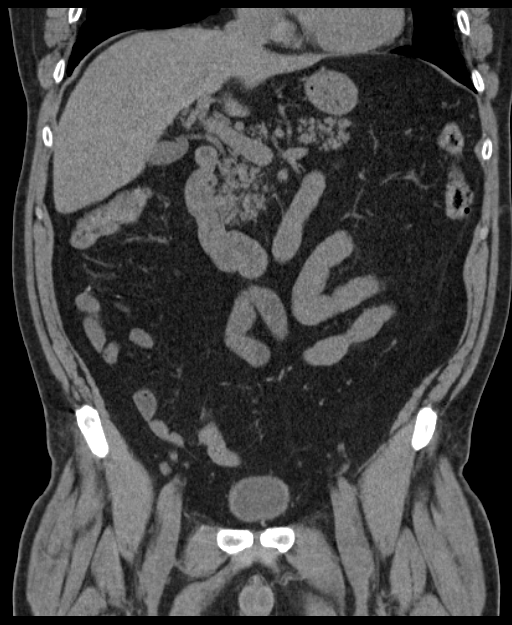
[im 66/149  bone]
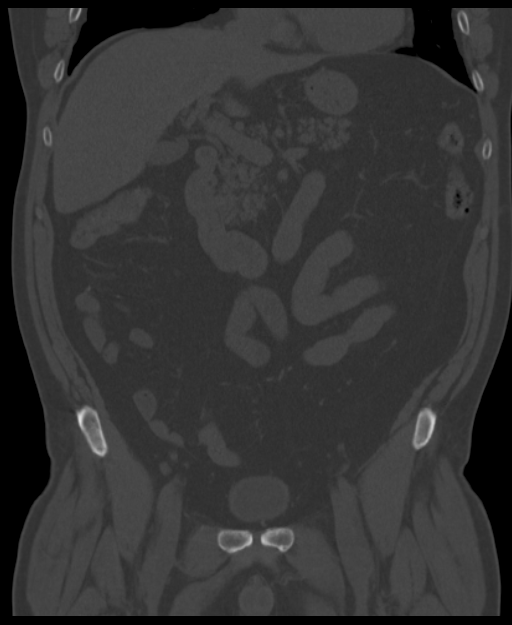
[im 83/149  soft-tissue]
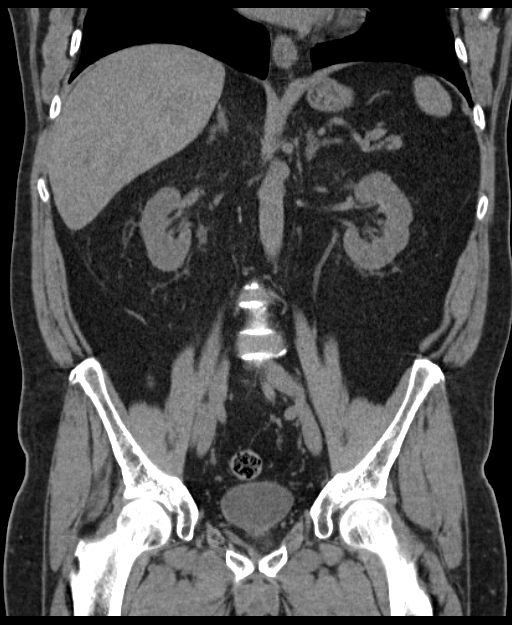

[Series 6: sagittal · sagittal · 0.55mm/px · 1 of 193 slices shown, 2 images]
[im 65/193  soft-tissue]
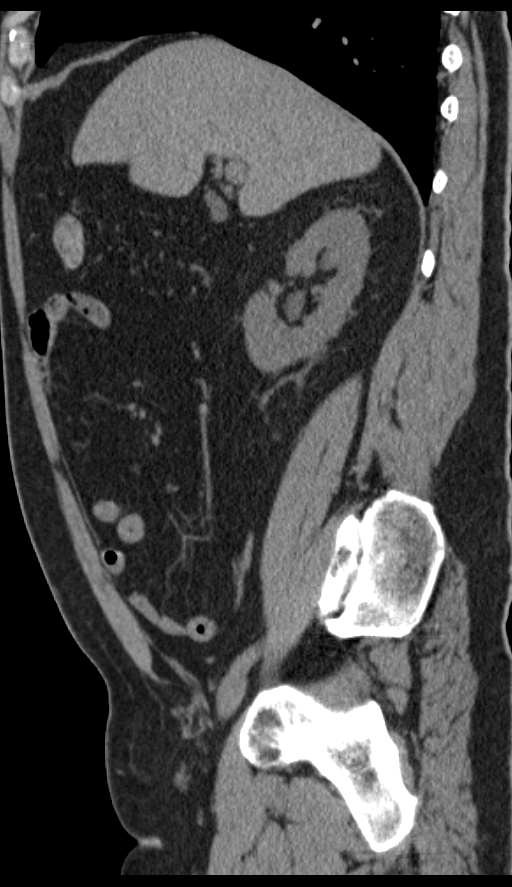
[im 65/193  bone]
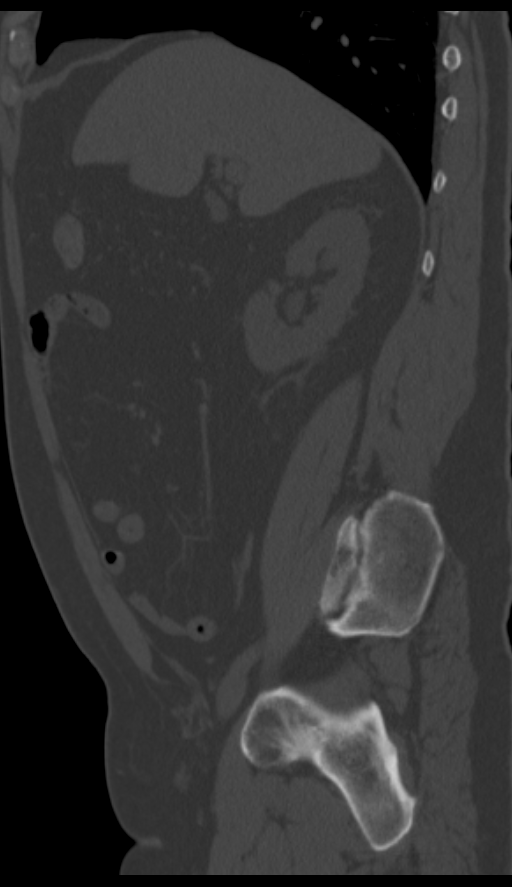

[9 of 46 positions shown; findings below may reference images not displayed]

FINDINGS: Lower chest: Normal heart size. Dependent atelectasis within the
bilateral lower lobes. No pleural effusion.

Hepatobiliary: Liver is normal in size and contour. Sub cm
low-attenuation lesion posterior right hepatic lobe (image 16;
series 2), too small to characterize. Layering stones within the
gallbladder lumen. No gallbladder wall thickening or pericholecystic
fluid.

Pancreas: Unremarkable

Spleen: Unremarkable

Adrenals/Urinary Tract: The adrenal glands are normal. There is a
3.0 cm exophytic cyst off the superior pole of the left kidney. Two
adjacent 2 mm stones within the inferior pole of the right kidney.
Additionally there is mild right hydroureteronephrosis to the level
of the urinary bladder were there is a 4 mm stone at the level of
the right UVJ.

Stomach/Bowel: Patient status post appendectomy. No abnormal bowel
wall thickening or evidence for bowel obstruction. Normal morphology
to stomach.

Vascular/Lymphatic: Normal caliber abdominal aorta. No
retroperitoneal lymphadenopathy.

Other: Os is unremarkable.

Musculoskeletal: Lower thoracic and lumbar spine degenerative
changes.
IMPRESSION: There is mild right hydroureteronephrosis to the level of the
urinary bladder were there is a 4 mm stone at the level of the right
UVJ. It is unclear if this is still located within the distal right
ureter or has recently passed into the urinary bladder.

Cholelithiasis without CT evidence for acute cholecystitis.

## 2016-02-11 ENCOUNTER — Ambulatory Visit (INDEPENDENT_AMBULATORY_CARE_PROVIDER_SITE_OTHER): Payer: 59 | Admitting: Family Medicine

## 2016-02-11 ENCOUNTER — Encounter: Payer: Self-pay | Admitting: Family Medicine

## 2016-02-11 ENCOUNTER — Encounter (INDEPENDENT_AMBULATORY_CARE_PROVIDER_SITE_OTHER): Payer: Self-pay

## 2016-02-11 VITALS — BP 123/77 | HR 74 | Temp 98.2°F | Wt 212.0 lb

## 2016-02-11 DIAGNOSIS — J069 Acute upper respiratory infection, unspecified: Secondary | ICD-10-CM

## 2016-02-11 MED ORDER — AMOXICILLIN-POT CLAVULANATE 875-125 MG PO TABS: 1.0000 | ORAL_TABLET | Freq: Two times a day (BID) | ORAL | 0 refills | Status: DC

## 2016-02-11 NOTE — Progress Notes (Signed)
   BP 123/77   Pulse 74   Temp 98.2 F (36.8 C)   Wt 212 lb (96.2 kg)   SpO2 96%   BMI 29.57 kg/m    Subjective:    Patient ID: Devin Adams, male    DOB: [birth year], 65 y.o.   MRN: [id number]  HPI: Devin Adams is a 65 y.o. male  Chief Complaint  Patient presents with  . Sinusitis    x approx 8 days; sinus/head congestion, upper sore throat, drainage. Some cough. No chest congestion.   8 day history of congestion, sore throat, nasal drainage, and cough. Denies fever, chills, CP, SOB, or wheezing. Has been taking dayquil and nyquil with no relief. Denies sick contacts.   Relevant past medical, surgical, family and social history reviewed and updated as indicated. Interim medical history since our last visit reviewed. Allergies and medications reviewed and updated.  Review of Systems  Constitutional: Negative.   HENT: Positive for congestion, postnasal drip, rhinorrhea and sore throat.   Eyes: Negative.   Respiratory: Positive for cough.   Cardiovascular: Negative.   Gastrointestinal: Negative.   Genitourinary: Negative.   Musculoskeletal: Negative.   Neurological: Negative.   Psychiatric/Behavioral: Negative.     Per HPI unless specifically indicated above     Objective:    BP 123/77   Pulse 74   Temp 98.2 F (36.8 C)   Wt 212 lb (96.2 kg)   SpO2 96%   BMI 29.57 kg/m   Wt Readings from Last 3 Encounters:  02/11/16 212 lb (96.2 kg)  07/06/15 226 lb (102.5 kg)  05/28/15 224 lb (101.6 kg)    Physical Exam  Constitutional: He is oriented to person, place, and time. He appears well-developed and well-nourished. No distress.  HENT:  Head: Atraumatic.  Right Ear: External ear normal.  Left Ear: External ear normal.  Nose: Nose normal.  Mouth/Throat: No oropharyngeal exudate.  Oropharynx erythematous, mild tonsillar edema  Eyes: Conjunctivae are normal. Pupils are equal, round, and reactive to light. No scleral icterus.  Neck: Normal range of motion.  Neck supple.  Cardiovascular: Normal rate, regular rhythm and normal heart sounds.   Pulmonary/Chest: Effort normal and breath sounds normal. No respiratory distress.  Musculoskeletal: Normal range of motion.  Neurological: He is alert and oriented to person, place, and time.  Skin: Skin is warm and dry.  Psychiatric: He has a normal mood and affect. His behavior is normal.  Nursing note and vitals reviewed.     Assessment & Plan:   Problem List Items Addressed This Visit    None    Visit Diagnoses    Upper respiratory tract infection, unspecified type    -  Primary   Given duration, will treat with augmentin. Recommended mucinex, zyrtec, and good water intake. Follow up if no improvement       Follow up plan: Return if symptoms worsen or fail to improve.

## 2016-02-11 NOTE — Patient Instructions (Signed)
Mucinex, Claritin (or zyrtec or allegra - they're all the same type of medicine), LOTS of water

## 2016-03-11 ENCOUNTER — Other Ambulatory Visit: Payer: Self-pay | Admitting: Family Medicine

## 2016-03-11 MED ORDER — AMOXICILLIN-POT CLAVULANATE 875-125 MG PO TABS: 1.0000 | ORAL_TABLET | Freq: Two times a day (BID) | ORAL | 0 refills | Status: DC

## 2016-03-11 NOTE — Telephone Encounter (Signed)
Pt would like a call back regarding his Augmentin refill request.  Please call patient asap.

## 2016-03-11 NOTE — Telephone Encounter (Signed)
Patient stated his sinus infection has came back.  Nasal Congestion-Green/Tan/Bloody in color.  Patient states his throat hurts, but it's from drainage. Once he gets congestion out, his throat doesn't hurt.   Patient would like a refill on his antibiotic from last time.

## 2016-04-01 ENCOUNTER — Other Ambulatory Visit: Payer: Self-pay | Admitting: Family Medicine

## 2016-04-01 MED ORDER — GABAPENTIN 300 MG PO CAPS: 900.0000 mg | ORAL_CAPSULE | Freq: Every day | ORAL | 0 refills | Status: DC

## 2016-04-01 NOTE — Telephone Encounter (Signed)
Pt has been taking 900 mg QHS instead of 600 mg QHS, therefore he is out of medication. Refill T'd up as 600 mg QHS. Please advise.

## 2016-04-01 NOTE — Telephone Encounter (Signed)
Pt's wife called stated pt has ran out of his Gabapentin because he has been taking 3 pills instead of 2. Pt is out of this medication. Pharm [facility]. Thanks.

## 2016-04-07 ENCOUNTER — Other Ambulatory Visit: Payer: Self-pay

## 2016-04-07 MED ORDER — GABAPENTIN 300 MG PO CAPS: 900.0000 mg | ORAL_CAPSULE | Freq: Every day | ORAL | 1 refills | Status: DC

## 2016-04-07 NOTE — Telephone Encounter (Addendum)
Patient's wife called about patient's Gabapentin.  [facility] won't fill the prescription because they have a question for the provider. Patient's wife said they faxed something over, but I haven't received anything yet.   Wondering if patient's [facility] could [facility] so patient can get it today. Patient is completely out. I will call [facility] and cancel that prescription.

## 2016-04-08 ENCOUNTER — Telehealth: Payer: Self-pay | Admitting: Family Medicine

## 2016-04-08 MED ORDER — GABAPENTIN 300 MG PO CAPS: 900.0000 mg | ORAL_CAPSULE | Freq: Every day | ORAL | 1 refills | Status: DC

## 2016-04-08 NOTE — Telephone Encounter (Signed)
Patients wife called regarding a problem they are having getting patients Gabapentin refilled.  Per patients wife mail order will not refill because the script was not sent quick enough to them or [facility] will not [facility] stating it is to soon to refill.  Per patients wife the patient has been taking 3 capsules at night instead of 1 at bedtime so this is the reason for the shortage of his medication.  Thank Dennis Bast Santiago Glad  [phone]

## 2016-04-08 NOTE — Telephone Encounter (Signed)
Routing to provider. Current rx in chart has 2 different directions. Can we correct and resend rx to [facility]?

## 2016-05-26 DIAGNOSIS — Z85828 Personal history of other malignant neoplasm of skin: Secondary | ICD-10-CM | POA: Diagnosis not present

## 2016-05-26 DIAGNOSIS — L570 Actinic keratosis: Secondary | ICD-10-CM | POA: Diagnosis not present

## 2016-05-28 ENCOUNTER — Ambulatory Visit (INDEPENDENT_AMBULATORY_CARE_PROVIDER_SITE_OTHER): Payer: 59 | Admitting: Family Medicine

## 2016-05-28 ENCOUNTER — Encounter: Payer: Self-pay | Admitting: Family Medicine

## 2016-05-28 VITALS — BP 122/77 | HR 93 | Ht 70.0 in | Wt 217.8 lb

## 2016-05-28 DIAGNOSIS — Z1211 Encounter for screening for malignant neoplasm of colon: Secondary | ICD-10-CM | POA: Diagnosis not present

## 2016-05-28 DIAGNOSIS — G4730 Sleep apnea, unspecified: Secondary | ICD-10-CM | POA: Diagnosis not present

## 2016-05-28 DIAGNOSIS — Z125 Encounter for screening for malignant neoplasm of prostate: Secondary | ICD-10-CM

## 2016-05-28 DIAGNOSIS — Z0000 Encounter for general adult medical examination without abnormal findings: Secondary | ICD-10-CM

## 2016-05-28 DIAGNOSIS — F3342 Major depressive disorder, recurrent, in full remission: Secondary | ICD-10-CM | POA: Diagnosis not present

## 2016-05-28 DIAGNOSIS — Z1329 Encounter for screening for other suspected endocrine disorder: Secondary | ICD-10-CM | POA: Diagnosis not present

## 2016-05-28 DIAGNOSIS — Z23 Encounter for immunization: Secondary | ICD-10-CM | POA: Diagnosis not present

## 2016-05-28 DIAGNOSIS — Z13220 Encounter for screening for lipoid disorders: Secondary | ICD-10-CM | POA: Diagnosis not present

## 2016-05-28 DIAGNOSIS — E785 Hyperlipidemia, unspecified: Secondary | ICD-10-CM

## 2016-05-28 LAB — URINALYSIS, ROUTINE W REFLEX MICROSCOPIC
Bilirubin, UA: NEGATIVE
Glucose, UA: NEGATIVE
Ketones, UA: NEGATIVE
LEUKOCYTES UA: NEGATIVE
NITRITE UA: NEGATIVE
Protein, UA: NEGATIVE
RBC UA: NEGATIVE
UUROB: 0.2 mg/dL (ref 0.2–1.0)
pH, UA: 5 (ref 5.0–7.5)

## 2016-05-28 MED ORDER — LOVASTATIN 40 MG PO TABS: ORAL_TABLET | ORAL | 4 refills | Status: DC

## 2016-05-28 MED ORDER — TAMSULOSIN HCL 0.4 MG PO CAPS: 0.4000 mg | ORAL_CAPSULE | Freq: Every day | ORAL | 4 refills | Status: DC

## 2016-05-28 MED ORDER — MOMETASONE FUROATE 50 MCG/ACT NA SUSP: 2.0000 | Freq: Every day | NASAL | 12 refills | Status: DC

## 2016-05-28 MED ORDER — GABAPENTIN 300 MG PO CAPS: 600.0000 mg | ORAL_CAPSULE | Freq: Every day | ORAL | 4 refills | Status: DC

## 2016-05-28 MED ORDER — VENLAFAXINE HCL ER 75 MG PO CP24: 75.0000 mg | ORAL_CAPSULE | Freq: Every day | ORAL | 4 refills | Status: DC

## 2016-05-28 MED ORDER — TAMSULOSIN HCL 0.4 MG PO CAPS
0.4000 mg | ORAL_CAPSULE | Freq: Every day | ORAL | 4 refills | Status: DC
Start: 2016-05-28 — End: 2016-05-28

## 2016-05-28 NOTE — Addendum Note (Signed)
Addended byGolden Pop on: 05/28/2016 03:23 PM   Modules accepted: Orders

## 2016-05-28 NOTE — Assessment & Plan Note (Signed)
The current medical regimen is effective;  continue present plan and medications.  

## 2016-05-28 NOTE — Progress Notes (Signed)
BP 122/77   Pulse 93   Ht 5\' 10"  (1.778 m)   Wt 217 lb 12.8 oz (98.8 kg)   SpO2 95%   BMI 31.25 kg/m    Subjective:    Patient ID: Devin Adams, male    DOB: November 03, 1950, 66 y.o.   MRN: [id number]  HPI: Devin Adams is a 66 y.o. male  Chief Complaint  Patient presents with  . Annual Exam   Patient with intermittent fatigue spells comes on with heavy labor over this last weekend and would just give out about every 20 minutes or so. Had to rest and got better no real chest pain chest tightness symptoms. No other cardiovascular type symptoms. Patient has appointment with cardiology coming up on next month. Patient also has had some after heavy labor cramps especially in his anterior quadriceps right more so than left comes on in the evening after activities over goes away with stretching and some massage. Patient's also noticed some constipation changes started taking stool softeners and is doing okay. Also for good timing is doing his colonoscopy. Nerves are doing okay with venlafaxine. Flomax doing well for BPH symptoms. Lovastatin is doing well for cholesterol no issues. Gabapentin helps primarily with sleep Relevant past medical, surgical, family and social history reviewed and updated as indicated. Interim medical history since our last visit reviewed. Allergies and medications reviewed and updated.  Review of Systems  Constitutional: Negative.   HENT: Negative.   Eyes: Negative.   Respiratory: Negative.   Cardiovascular: Negative.   Gastrointestinal: Negative.   Endocrine: Negative.   Genitourinary: Negative.   Musculoskeletal: Negative.   Skin: Negative.   Allergic/Immunologic: Negative.   Neurological: Negative.   Hematological: Negative.   Psychiatric/Behavioral: Negative.     Per HPI unless specifically indicated above     Objective:    BP 122/77   Pulse 93   Ht 5\' 10"  (1.778 m)   Wt 217 lb 12.8 oz (98.8 kg)   SpO2 95%   BMI 31.25 kg/m   Wt  Readings from Last 3 Encounters:  05/28/16 217 lb 12.8 oz (98.8 kg)  02/11/16 212 lb (96.2 kg)  07/06/15 226 lb (102.5 kg)    Physical Exam  Constitutional: He is oriented to person, place, and time. He appears well-developed and well-nourished.  HENT:  Head: Normocephalic and atraumatic.  Right Ear: External ear normal.  Left Ear: External ear normal.  Eyes: Conjunctivae and EOM are normal. Pupils are equal, round, and reactive to light.  Neck: Normal range of motion. Neck supple.  Cardiovascular: Normal rate, regular rhythm, normal heart sounds and intact distal pulses.   Pulmonary/Chest: Effort normal and breath sounds normal.  Abdominal: Soft. Bowel sounds are normal. There is no splenomegaly or hepatomegaly.  Genitourinary: Rectum normal, prostate normal and penis normal.  Musculoskeletal: Normal range of motion.  Neurological: He is alert and oriented to person, place, and time. He has normal reflexes.  Skin: No rash noted. No erythema.  Psychiatric: He has a normal mood and affect. His behavior is normal. Judgment and thought content normal.    Results for orders placed or performed in visit on 05/28/15  Microscopic Examination  Result Value Ref Range   WBC, UA 0-5 0 - 5 /hpf   RBC, UA 0-2 0 - 2 /hpf   Epithelial Cells (non renal) 0-10 0 - 10 /hpf   Mucus, UA Present Not Estab.   Bacteria, UA Few None seen/Few  Hepatitis C Antibody  Result  Value Ref Range   Hep C Virus Ab <0.1 0.0 - 0.9 s/co ratio  Comprehensive metabolic panel  Result Value Ref Range   Glucose 101 (H) 65 - 99 mg/dL   BUN 15 8 - 27 mg/dL   Creatinine, Ser 1.05 0.76 - 1.27 mg/dL   GFR calc non Af Amer 74 >59 mL/min/1.73   GFR calc Af Amer 86 >59 mL/min/1.73   BUN/Creatinine Ratio 14 10 - 22   Sodium 141 134 - 144 mmol/L   Potassium 5.0 3.5 - 5.2 mmol/L   Chloride 102 96 - 106 mmol/L   CO2 23 18 - 29 mmol/L   Calcium 9.8 8.6 - 10.2 mg/dL   Total Protein 7.0 6.0 - 8.5 g/dL   Albumin 4.7 3.6 - 4.8  g/dL   Globulin, Total 2.3 1.5 - 4.5 g/dL   Albumin/Globulin Ratio 2.0 1.1 - 2.5   Bilirubin Total 0.3 0.0 - 1.2 mg/dL   Alkaline Phosphatase 88 39 - 117 IU/L   AST 32 0 - 40 IU/L   ALT 46 (H) 0 - 44 IU/L  Lipid panel  Result Value Ref Range   Cholesterol, Total 192 100 - 199 mg/dL   Triglycerides 135 0 - 149 mg/dL   HDL 53 >39 mg/dL   VLDL Cholesterol Cal 27 5 - 40 mg/dL   LDL Calculated 112 (H) 0 - 99 mg/dL   Chol/HDL Ratio 3.6 0.0 - 5.0 ratio units  CBC with Differential/Platelet  Result Value Ref Range   WBC 8.2 3.4 - 10.8 x10E3/uL   RBC 4.91 4.14 - 5.80 x10E6/uL   Hemoglobin 15.0 12.6 - 17.7 g/dL   Hematocrit 43.2 37.5 - 51.0 %   MCV 88 79 - 97 fL   MCH 30.5 26.6 - 33.0 pg   MCHC 34.7 31.5 - 35.7 g/dL   RDW 13.3 12.3 - 15.4 %   Platelets 321 150 - 379 x10E3/uL   Neutrophils 63 %   Lymphs 18 %   Monocytes 10 %   Eos 8 %   Basos 1 %   Neutrophils Absolute 5.2 1.4 - 7.0 x10E3/uL   Lymphocytes Absolute 1.5 0.7 - 3.1 x10E3/uL   Monocytes Absolute 0.8 0.1 - 0.9 x10E3/uL   EOS (ABSOLUTE) 0.6 (H) 0.0 - 0.4 x10E3/uL   Basophils Absolute 0.0 0.0 - 0.2 x10E3/uL   Immature Granulocytes 0 %   Immature Grans (Abs) 0.0 0.0 - 0.1 x10E3/uL  TSH  Result Value Ref Range   TSH 2.220 0.450 - 4.500 uIU/mL  Urinalysis, Routine w reflex microscopic (not at [facility])  Result Value Ref Range   Specific Gravity, UA >1.030 (H) 1.005 - 1.030   pH, UA 5.0 5.0 - 7.5   Color, UA Yellow Yellow   Appearance Ur Clear Clear   Leukocytes, UA Negative Negative   Protein, UA Negative Negative/Trace   Glucose, UA Negative Negative   Ketones, UA Negative Negative   RBC, UA Trace (A) Negative   Bilirubin, UA Negative Negative   Urobilinogen, Ur 0.2 0.2 - 1.0 mg/dL   Nitrite, UA Negative Negative   Microscopic Examination See below:   PSA  Result Value Ref Range   Prostate Specific Ag, Serum 0.9 0.0 - 4.0 ng/mL      Assessment & Plan:   Problem List Items Addressed This Visit      Respiratory     Sleep apnea    Uses CPAP without problems      Relevant Medications   gabapentin (NEURONTIN) 300 MG  capsule     Other   Hyperlipidemia    The current medical regimen is effective;  continue present plan and medications.       Relevant Medications   lovastatin (MEVACOR) 40 MG tablet   Other Relevant Orders   CBC with Differential/Platelet   Comprehensive metabolic panel   Urinalysis, Routine w reflex microscopic   Depression    The current medical regimen is effective;  continue present plan and medications.       Relevant Medications   venlafaxine XR (EFFEXOR-XR) 75 MG 24 hr capsule    Other Visit Diagnoses    Annual physical exam    -  Primary   Relevant Orders   CBC with Differential/Platelet   Comprehensive metabolic panel   Lipid panel   TSH   PSA   Urinalysis, Routine w reflex microscopic   Screening cholesterol level       Relevant Orders   Lipid panel   Thyroid disorder screen       Relevant Orders   TSH   Prostate cancer screening       Relevant Orders   PSA   Need for pneumococcal vaccination       Relevant Orders   Pneumococcal polysaccharide vaccine 23-valent greater than or equal to 2yo subcutaneous/IM (Completed)   Colon cancer screening       Relevant Orders   Ambulatory referral to Gastroenterology       Follow up plan: Return in about 1 year (around 05/28/2017) for Physical Exam.

## 2016-05-28 NOTE — Assessment & Plan Note (Signed)
Uses CPAP without problems 

## 2016-05-29 ENCOUNTER — Other Ambulatory Visit: Payer: Self-pay

## 2016-05-29 ENCOUNTER — Encounter: Payer: Self-pay | Admitting: Family Medicine

## 2016-05-29 ENCOUNTER — Telehealth: Payer: Self-pay

## 2016-05-29 DIAGNOSIS — Z86010 Personal history of colonic polyps: Secondary | ICD-10-CM

## 2016-05-29 LAB — CBC WITH DIFFERENTIAL/PLATELET
BASOS ABS: 0 10*3/uL (ref 0.0–0.2)
Basos: 1 %
EOS (ABSOLUTE): 0.5 10*3/uL — AB (ref 0.0–0.4)
Eos: 8 %
HEMOGLOBIN: 14.2 g/dL (ref 13.0–17.7)
Hematocrit: 41.4 % (ref 37.5–51.0)
IMMATURE GRANS (ABS): 0 10*3/uL (ref 0.0–0.1)
IMMATURE GRANULOCYTES: 0 %
LYMPHS: 23 %
Lymphocytes Absolute: 1.4 10*3/uL (ref 0.7–3.1)
MCH: 30.3 pg (ref 26.6–33.0)
MCHC: 34.3 g/dL (ref 31.5–35.7)
MCV: 89 fL (ref 79–97)
MONOCYTES: 8 %
Monocytes Absolute: 0.5 10*3/uL (ref 0.1–0.9)
NEUTROS PCT: 60 %
Neutrophils Absolute: 3.6 10*3/uL (ref 1.4–7.0)
PLATELETS: 309 10*3/uL (ref 150–379)
RBC: 4.68 x10E6/uL (ref 4.14–5.80)
RDW: 13.2 % (ref 12.3–15.4)
WBC: 6 10*3/uL (ref 3.4–10.8)

## 2016-05-29 LAB — COMPREHENSIVE METABOLIC PANEL
ALBUMIN: 4.4 g/dL (ref 3.6–4.8)
ALT: 35 IU/L (ref 0–44)
AST: 27 IU/L (ref 0–40)
Albumin/Globulin Ratio: 1.9 (ref 1.2–2.2)
Alkaline Phosphatase: 90 IU/L (ref 39–117)
BUN / CREAT RATIO: 16 (ref 10–24)
BUN: 17 mg/dL (ref 8–27)
Bilirubin Total: 0.4 mg/dL (ref 0.0–1.2)
CALCIUM: 9.4 mg/dL (ref 8.6–10.2)
CHLORIDE: 100 mmol/L (ref 96–106)
CO2: 23 mmol/L (ref 18–29)
CREATININE: 1.04 mg/dL (ref 0.76–1.27)
GFR calc non Af Amer: 74 mL/min/{1.73_m2} (ref >59)
GFR, EST AFRICAN AMERICAN: 86 mL/min/{1.73_m2} (ref >59)
GLUCOSE: 99 mg/dL (ref 65–99)
Globulin, Total: 2.3 g/dL (ref 1.5–4.5)
Potassium: 4.2 mmol/L (ref 3.5–5.2)
Sodium: 141 mmol/L (ref 134–144)
TOTAL PROTEIN: 6.7 g/dL (ref 6.0–8.5)

## 2016-05-29 LAB — LIPID PANEL
Chol/HDL Ratio: 3.7 ratio units (ref 0.0–5.0)
Cholesterol, Total: 167 mg/dL (ref 100–199)
HDL: 45 mg/dL (ref >39)
LDL CALC: 92 mg/dL (ref 0–99)
Triglycerides: 151 mg/dL — ABNORMAL HIGH (ref 0–149)
VLDL CHOLESTEROL CAL: 30 mg/dL (ref 5–40)

## 2016-05-29 LAB — PSA: PROSTATE SPECIFIC AG, SERUM: 1 ng/mL (ref 0.0–4.0)

## 2016-05-29 LAB — TSH: TSH: 2.33 u[IU]/mL (ref 0.450–4.500)

## 2016-05-29 NOTE — Telephone Encounter (Signed)
Gastroenterology Pre-Procedure Review  Request Date: 06/16/16 Requesting Physician: Dr. Allen Norris  PATIENT REVIEW QUESTIONS: The patient responded to the following health history questions as indicated:    1. Are you having any GI issues? no 2. Do you have a personal history of Polyps? yes (removed) 3. Do you have a family history of Colon Cancer or Polyps? no 4. Diabetes Mellitus? no 5. Joint replacements in the past 12 months?no 6. Major health problems in the past 3 months?no 7. Any artificial heart valves, MVP, or defibrillator?no    MEDICATIONS & ALLERGIES:    Patient reports the following regarding taking any anticoagulation/antiplatelet therapy:   Plavix, Coumadin, Eliquis, Xarelto, Lovenox, Pradaxa, Brilinta, or Effient? no Aspirin? yes (81 mg)  Patient confirms/reports the following medications:  Current Outpatient Prescriptions  Medication Sig Dispense Refill  . amoxicillin-clavulanate (AUGMENTIN) 875-125 MG tablet     . aspirin 81 MG tablet Take 81 mg by mouth daily.      Marland Kitchen CIALIS 5 MG tablet Take 5 mg by mouth daily.    Marland Kitchen gabapentin (NEURONTIN) 300 MG capsule Take 2 capsules (600 mg total) by mouth at bedtime. 180 capsule 4  . lovastatin (MEVACOR) 40 MG tablet Take 1 tablet by mouth at  bedtime 90 tablet 4  . mometasone (NASONEX) 50 MCG/ACT nasal spray Place 2 sprays into the nose daily. 17 g 12  . tamsulosin (FLOMAX) 0.4 MG CAPS capsule Take 1 capsule (0.4 mg total) by mouth daily. 90 capsule 4  . venlafaxine XR (EFFEXOR-XR) 75 MG 24 hr capsule Take 1 capsule (75 mg total) by mouth daily with breakfast. 90 capsule 4   No current facility-administered medications for this visit.     Patient confirms/reports the following allergies:  No Known Allergies  No orders of the defined types were placed in this encounter.   AUTHORIZATION INFORMATION Primary Insurance: 1D#: Group #:  Secondary Insurance: 1D#: Group #:  SCHEDULE INFORMATION: Date:  06/16/16 Time: Location: [place]

## 2016-06-10 ENCOUNTER — Encounter: Payer: Self-pay | Admitting: *Deleted

## 2016-06-13 NOTE — Discharge Instructions (Signed)

## 2016-06-16 ENCOUNTER — Other Ambulatory Visit: Payer: Self-pay | Admitting: Family Medicine

## 2016-06-16 ENCOUNTER — Encounter
Admission: RE | Disposition: A | Discharge status: Home / Self Care | Payer: Self-pay | Source: Ambulatory Visit | Attending: Gastroenterology

## 2016-06-16 ENCOUNTER — Ambulatory Visit: Payer: Commercial Managed Care - HMO | Admitting: Anesthesiology

## 2016-06-16 ENCOUNTER — Ambulatory Visit
Admission: RE | Admit: 2016-06-16 | Discharge: 2016-06-16 | Disposition: A | Discharge status: Home / Self Care | Payer: Commercial Managed Care - HMO | Source: Ambulatory Visit | Attending: Gastroenterology | Admitting: Gastroenterology

## 2016-06-16 DIAGNOSIS — D123 Benign neoplasm of transverse colon: Secondary | ICD-10-CM | POA: Diagnosis not present

## 2016-06-16 DIAGNOSIS — E785 Hyperlipidemia, unspecified: Secondary | ICD-10-CM | POA: Insufficient documentation

## 2016-06-16 DIAGNOSIS — Z9989 Dependence on other enabling machines and devices: Secondary | ICD-10-CM | POA: Diagnosis not present

## 2016-06-16 DIAGNOSIS — Z7982 Long term (current) use of aspirin: Secondary | ICD-10-CM | POA: Insufficient documentation

## 2016-06-16 DIAGNOSIS — N400 Enlarged prostate without lower urinary tract symptoms: Secondary | ICD-10-CM | POA: Insufficient documentation

## 2016-06-16 DIAGNOSIS — Z79899 Other long term (current) drug therapy: Secondary | ICD-10-CM | POA: Insufficient documentation

## 2016-06-16 DIAGNOSIS — K640 First degree hemorrhoids: Secondary | ICD-10-CM | POA: Diagnosis not present

## 2016-06-16 DIAGNOSIS — G4730 Sleep apnea, unspecified: Secondary | ICD-10-CM | POA: Insufficient documentation

## 2016-06-16 DIAGNOSIS — F329 Major depressive disorder, single episode, unspecified: Secondary | ICD-10-CM | POA: Insufficient documentation

## 2016-06-16 DIAGNOSIS — Z1211 Encounter for screening for malignant neoplasm of colon: Secondary | ICD-10-CM | POA: Insufficient documentation

## 2016-06-16 DIAGNOSIS — K635 Polyp of colon: Secondary | ICD-10-CM | POA: Diagnosis not present

## 2016-06-16 DIAGNOSIS — Z8719 Personal history of other diseases of the digestive system: Secondary | ICD-10-CM | POA: Insufficient documentation

## 2016-06-16 DIAGNOSIS — Z86010 Personal history of colonic polyps: Secondary | ICD-10-CM | POA: Diagnosis not present

## 2016-06-16 HISTORY — PX: COLONOSCOPY WITH PROPOFOL: SHX5780

## 2016-06-16 HISTORY — PX: POLYPECTOMY: SHX5525

## 2016-06-16 SURGERY — COLONOSCOPY WITH PROPOFOL
Anesthesia: Monitor Anesthesia Care | Wound class: Contaminated

## 2016-06-16 MED ORDER — STERILE WATER FOR IRRIGATION IR SOLN
Status: DC | PRN
  Administered 2016-06-16: 11:00:00

## 2016-06-16 MED ORDER — LACTATED RINGERS IV SOLN
INTRAVENOUS | Status: DC
  Administered 2016-06-16 (×2): via INTRAVENOUS

## 2016-06-16 MED ORDER — PROPOFOL 10 MG/ML IV BOLUS
INTRAVENOUS | Status: DC | PRN
  Administered 2016-06-16: 30 mg via INTRAVENOUS
  Administered 2016-06-16: 10 mg via INTRAVENOUS
  Administered 2016-06-16: 20 mg via INTRAVENOUS
  Administered 2016-06-16: 70 mg via INTRAVENOUS

## 2016-06-16 MED ORDER — LIDOCAINE HCL (CARDIAC) 20 MG/ML IV SOLN
INTRAVENOUS | Status: DC | PRN
  Administered 2016-06-16: 20 mg via INTRAVENOUS

## 2016-06-16 SURGICAL SUPPLY — 23 items

## 2016-06-16 NOTE — Anesthesia Procedure Notes (Signed)
Procedure Name: MAC Performed by: Alessia Gonsalez Pre-anesthesia Checklist: Patient identified, Emergency Drugs available, Suction available, Patient being monitored and Timeout performed Patient Re-evaluated:Patient Re-evaluated prior to inductionOxygen Delivery Method: Nasal cannula       

## 2016-06-16 NOTE — Anesthesia Preprocedure Evaluation (Signed)
Anesthesia Evaluation  Patient identified by MRN, date of birth, ID band Patient awake    Reviewed: Allergy & Precautions, NPO status , Patient's Chart, lab work & pertinent test results  Airway Mallampati: II  TM Distance: >3 FB Neck ROM: Full    Dental no notable dental hx.    Pulmonary sleep apnea and Continuous Positive Airway Pressure Ventilation ,    Pulmonary exam normal        Cardiovascular negative cardio ROS Normal cardiovascular exam     Neuro/Psych PSYCHIATRIC DISORDERS Depression    GI/Hepatic negative GI ROS, Neg liver ROS,   Endo/Other  negative endocrine ROS  Renal/GU negative Renal ROS     Musculoskeletal negative musculoskeletal ROS (+)   Abdominal   Peds  Hematology negative hematology ROS (+)   Anesthesia Other Findings   Reproductive/Obstetrics                             Anesthesia Physical Anesthesia Plan  ASA: II  Anesthesia Plan: MAC   Post-op Pain Management:    Induction: Intravenous  Airway Management Planned:   Additional Equipment:   Intra-op Plan:   Post-operative Plan:   Informed Consent: I have reviewed the patients History and Physical, chart, labs and discussed the procedure including the risks, benefits and alternatives for the proposed anesthesia with the patient or authorized representative who has indicated his/her understanding and acceptance.     Plan Discussed with: CRNA  Anesthesia Plan Comments:         Anesthesia Quick Evaluation

## 2016-06-16 NOTE — Transfer of Care (Signed)
Immediate Anesthesia Transfer of Care Note  Patient: Devin Adams  Procedure(s) Performed: Procedure(s) with comments: COLONOSCOPY WITH PROPOFOL (N/A) - sleep apnea POLYPECTOMY  Patient Location: PACU  Anesthesia Type: MAC  Level of Consciousness: awake, alert  and patient cooperative  Airway and Oxygen Therapy: Patient Spontanous Breathing and Patient connected to supplemental oxygen  Post-op Assessment: Post-op Vital signs reviewed, Patient's Cardiovascular Status Stable, Respiratory Function Stable, Patent Airway and No signs of Nausea or vomiting  Post-op Vital Signs: Reviewed and stable  Complications: No apparent anesthesia complications

## 2016-06-16 NOTE — H&P (Signed)
Devin Lame, MD [address] [place], [place] [address] [phone] Fax : [phone]  Primary Care Physician:  Golden Pop, MD Primary Gastroenterologist:  Dr. Allen Norris  Pre-Procedure History & Physical: HPI:  Devin Adams is a 66 y.o. male is here for an colonoscopy.   Past Medical History:  Diagnosis Date  . Enlarged prostate   . History of kidney stones   . Hyperlipidemia   . Insomnia, unspecified   . Kidney stone   . Other testicular hypofunction   . Sleep apnea    CPAP    Past Surgical History:  Procedure Laterality Date  . APPENDECTOMY    . ESOPHAGEAL DILATION    . LASER OF PROSTATE W/ GREEN LIGHT PVP    . THYROID CYST EXCISION      Prior to Admission medications   Medication Sig Start Date End Date Taking? Authorizing Provider  aspirin 81 MG tablet Take 81 mg by mouth daily.     Yes Historical Provider, MD  CIALIS 5 MG tablet Take 5 mg by mouth daily. 11/23/14  Yes Historical Provider, MD  gabapentin (NEURONTIN) 300 MG capsule Take 2 capsules (600 mg total) by mouth at bedtime. 05/28/16  Yes Guadalupe Maple, MD  lovastatin (MEVACOR) 40 MG tablet Take 1 tablet by mouth at  bedtime 05/28/16  Yes Guadalupe Maple, MD  mometasone (NASONEX) 50 MCG/ACT nasal spray Place 2 sprays into the nose daily. 05/28/16  Yes Guadalupe Maple, MD  Multiple Vitamin (MULTIVITAMIN) tablet Take 1 tablet by mouth daily.   Yes Historical Provider, MD  Omega-3 Fatty Acids (FISH OIL PO) Take by mouth daily.   Yes Historical Provider, MD  tamsulosin (FLOMAX) 0.4 MG CAPS capsule Take 1 capsule (0.4 mg total) by mouth daily. 05/28/16  Yes Guadalupe Maple, MD  venlafaxine XR (EFFEXOR-XR) 75 MG 24 hr capsule Take 1 capsule (75 mg total) by mouth daily with breakfast. 05/28/16  Yes Guadalupe Maple, MD    Allergies as of 05/29/2016  . (No Known Allergies)    Family History  Problem Relation Age of Onset  . Heart failure      CHF-family hx    Social History   Social  History  . Marital status: Married    Spouse name: N/A  . Number of children: N/A  . Years of education: N/A   Occupational History  . Not on file.   Social History Main Topics  . Smoking status: Never Smoker  . Smokeless tobacco: Never Used  . Alcohol use 1.8 oz/week    3 Cans of beer per week     Comment:    . Drug use: No  . Sexual activity: Not on file   Other Topics Concern  . Not on file   Social History Narrative   Part time; married; does not get regular exercise.    Review of Systems: See HPI, otherwise negative ROS  Physical Exam: BP 123/73   Pulse (!) 57   Temp 97.9 F (36.6 C) (Tympanic)   Resp 16   Ht 5\' 10"  (1.778 m)   Wt 214 lb (97.1 kg)   SpO2 97%   BMI 30.71 kg/m  General:   Alert,  pleasant and cooperative in NAD Head:  Normocephalic and atraumatic. Neck:  Supple; no masses or thyromegaly. Lungs:  Clear throughout to auscultation.    Heart:  Regular rate and rhythm. Abdomen:  Soft, nontender and nondistended. Normal bowel sounds, without guarding, and without rebound.  Neurologic:  Alert and  oriented x4;  grossly normal neurologically.  Impression/Plan: [name] is here for an colonoscopy to be performed for history of polyps  Risks, benefits, limitations, and alternatives regarding  colonoscopy have been reviewed with the patient.  Questions have been answered.  All parties agreeable.   Devin Lame, MD  06/16/2016, 9:45 AM

## 2016-06-16 NOTE — Op Note (Signed)
[facility] Patient Name: Devin Adams Procedure Date: 06/16/2016 10:20 AM MRN: [id number] Account #: [id number] Date of Birth: [birth year] Admit Type: Outpatient Age: 66 Room: [facility] OR ROOM 01 Gender: Male Note Status: Finalized Procedure:            Colonoscopy Indications:          High risk colon cancer surveillance: Personal history                        of colonic polyps Providers:            Lucilla Lame MD, MD Referring MD:         Guadalupe Maple, MD (Referring MD) Medicines:            Propofol per Anesthesia Complications:        No immediate complications. Procedure:            Pre-Anesthesia Assessment:                       - Prior to the procedure, a History and Physical was                        performed, and patient medications and allergies were                        reviewed. The patient's tolerance of previous                        anesthesia was also reviewed. The risks and benefits of                        the procedure and the sedation options and risks were                        discussed with the patient. All questions were                        answered, and informed consent was obtained. Prior                        Anticoagulants: The patient has taken no previous                        anticoagulant or antiplatelet agents. ASA Grade                        Assessment: II - A patient with mild systemic disease.                        After reviewing the risks and benefits, the patient was                        deemed in satisfactory condition to undergo the                        procedure.                       After obtaining informed consent, the colonoscope was  passed under direct vision. Throughout the procedure,                        the patient's blood pressure, pulse, and oxygen                        saturations were monitored continuously. The Olympus   Colonoscope [id number]) was introduced through the                        anus and advanced to the the cecum, identified by                        appendiceal orifice and ileocecal valve. The                        colonoscopy was performed without difficulty. The                        patient tolerated the procedure well. The quality of                        the bowel preparation was excellent. Findings:      The perianal and digital rectal examinations were normal.      A 3 mm polyp was found in the transverse colon. The polyp was sessile.       The polyp was removed with a cold biopsy forceps. Resection and       retrieval were complete.      Non-bleeding internal hemorrhoids were found during retroflexion. The       hemorrhoids were Grade I (internal hemorrhoids that do not prolapse). Impression:           - One 3 mm polyp in the transverse colon, removed with                        a cold biopsy forceps. Resected and retrieved.                       - Non-bleeding internal hemorrhoids. Recommendation:       - Discharge patient to home.                       - Resume previous diet.                       - Continue present medications.                       - Await pathology results.                       - Repeat colonoscopy in 5 years for surveillance. Procedure Code(s):    --- Professional ---                       [phone], Colonoscopy, flexible; with biopsy, single or                        multiple Diagnosis Code(s):    --- Professional ---  Z86.010, Personal history of colonic polyps                       D12.3, Benign neoplasm of transverse colon (hepatic                        flexure or splenic flexure) CPT copyright 2016 American Medical Association. All rights reserved. The codes documented in this report are preliminary and upon coder review may  be revised to meet current compliance requirements. Lucilla Lame MD, MD 06/16/2016 10:44:51 AM This  report has been signed electronically. Number of Addenda: 0 Note Initiated On: 06/16/2016 10:20 AM Scope Withdrawal Time: 0 hours 5 minutes 46 seconds  Total Procedure Duration: 0 hours 7 minutes 53 seconds       [facility]

## 2016-06-16 NOTE — Anesthesia Postprocedure Evaluation (Signed)
Anesthesia Post Note  Patient: Devin Adams  Procedure(s) Performed: Procedure(s) (LRB): COLONOSCOPY WITH PROPOFOL (N/A) POLYPECTOMY  Patient location during evaluation: PACU Anesthesia Type: MAC Level of consciousness: awake and alert and oriented Pain management: pain level controlled Vital Signs Assessment: post-procedure vital signs reviewed and stable Respiratory status: spontaneous breathing and nonlabored ventilation Cardiovascular status: stable Postop Assessment: no signs of nausea or vomiting and adequate PO intake Anesthetic complications: no    Estill Batten

## 2016-06-17 ENCOUNTER — Encounter: Payer: Self-pay | Admitting: Gastroenterology

## 2016-06-18 ENCOUNTER — Encounter: Payer: Self-pay | Admitting: Gastroenterology

## 2016-06-23 ENCOUNTER — Encounter: Payer: Self-pay | Admitting: Gastroenterology

## 2016-06-24 NOTE — Telephone Encounter (Signed)
Error

## 2016-07-15 ENCOUNTER — Telehealth: Payer: Self-pay | Admitting: Family Medicine

## 2016-07-15 NOTE — Telephone Encounter (Signed)
Rx sent over in Feb. For 1 year supply.

## 2016-08-14 ENCOUNTER — Other Ambulatory Visit: Payer: Self-pay | Admitting: Family Medicine

## 2016-09-27 ENCOUNTER — Other Ambulatory Visit: Payer: Self-pay | Admitting: Family Medicine

## 2016-09-28 NOTE — Telephone Encounter (Signed)
apt 

## 2016-10-07 ENCOUNTER — Other Ambulatory Visit: Payer: Self-pay

## 2016-10-07 MED ORDER — LOVASTATIN 40 MG PO TABS: 40.0000 mg | ORAL_TABLET | Freq: Every day | ORAL | 1 refills | Status: DC

## 2016-10-07 NOTE — Telephone Encounter (Signed)
Last OV: 05/02/16 Next OV: None on file.   Lab Results  Component Value Date   CHOL 167 05/28/2016   HDL 45 05/28/2016   LDLCALC 92 05/28/2016   TRIG 151 (H) 05/28/2016   CHOLHDL 3.7 05/28/2016   Lab Results  Component Value Date   CREATININE 1.04 05/28/2016   BUN 17 05/28/2016   NA 141 05/28/2016   K 4.2 05/28/2016   CL 100 05/28/2016   CO2 23 05/28/2016

## 2016-10-07 NOTE — Telephone Encounter (Signed)
apt 

## 2016-10-17 ENCOUNTER — Encounter: Payer: Self-pay | Admitting: Family Medicine

## 2016-10-20 ENCOUNTER — Encounter: Payer: Self-pay | Admitting: Family Medicine

## 2016-10-20 ENCOUNTER — Ambulatory Visit (INDEPENDENT_AMBULATORY_CARE_PROVIDER_SITE_OTHER): Payer: 59 | Admitting: Family Medicine

## 2016-10-20 VITALS — BP 132/83 | HR 71 | Temp 98.6°F | Wt 221.0 lb

## 2016-10-20 DIAGNOSIS — J069 Acute upper respiratory infection, unspecified: Secondary | ICD-10-CM | POA: Diagnosis not present

## 2016-10-20 MED ORDER — DOXYCYCLINE HYCLATE 100 MG PO TABS: 100.0000 mg | ORAL_TABLET | Freq: Two times a day (BID) | ORAL | 0 refills | Status: DC

## 2016-10-20 NOTE — Patient Instructions (Signed)
Follow up if no improvement 

## 2016-10-20 NOTE — Progress Notes (Signed)
   BP 132/83   Pulse 71   Temp 98.6 F (37 C)   Wt 221 lb (100.2 kg)   SpO2 93%   BMI 31.71 kg/m    Subjective:    Patient ID: Devin Adams, male    DOB: 24-Jan-1951, 66 y.o.   MRN: [id number]  HPI: Devin Adams is a 66 y.o. male  Chief Complaint  Patient presents with  . URI    x 6 weeks; sneezing, productive cough, chest congestion. sore throat in the beginning. No fever, No head congestion.    Patient presents with about 6 weeks of productive cough, congestion, malaise, and initially sore throat, fever, chills. Denies CP, SOB, ear pain, N/V. Took a zpack the second week of illness with no relief, hasn't tried anything OTC. No known sick contacts.   Relevant past medical, surgical, family and social history reviewed and updated as indicated. Interim medical history since our last visit reviewed. Allergies and medications reviewed and updated.  Review of Systems  Constitutional: Positive for fatigue.  HENT: Positive for congestion and sore throat.   Respiratory: Positive for cough.   Cardiovascular: Negative.   Gastrointestinal: Negative.   Genitourinary: Negative.   Musculoskeletal: Negative.   Skin: Negative.   Neurological: Negative.   Psychiatric/Behavioral: Negative.    Per HPI unless specifically indicated above     Objective:    BP 132/83   Pulse 71   Temp 98.6 F (37 C)   Wt 221 lb (100.2 kg)   SpO2 93%   BMI 31.71 kg/m   Wt Readings from Last 3 Encounters:  10/20/16 221 lb (100.2 kg)  06/16/16 214 lb (97.1 kg)  05/28/16 217 lb 12.8 oz (98.8 kg)    Physical Exam  Constitutional: He is oriented to person, place, and time. He appears well-developed and well-nourished. No distress.  HENT:  Head: Atraumatic.  Right Ear: External ear normal.  Left Ear: External ear normal.  Nasal mucosa and oropharynx erythematous  Eyes: Pupils are equal, round, and reactive to light. Conjunctivae are normal.  Neck: Normal range of motion. Neck supple.    Cardiovascular: Normal rate and normal heart sounds.   Pulmonary/Chest: Effort normal and breath sounds normal. No respiratory distress.  Musculoskeletal: Normal range of motion.  Lymphadenopathy:    He has cervical adenopathy.  Neurological: He is alert and oriented to person, place, and time.  Skin: Skin is warm and dry.  Psychiatric: He has a normal mood and affect. His behavior is normal.  Nursing note and vitals reviewed.     Assessment & Plan:   Problem List Items Addressed This Visit    None    Visit Diagnoses    Upper respiratory tract infection, unspecified type    -  Primary   WIll treat with doxycycline, mucinex, and delsym prn. Supportive care reviewed. F/u if worsening or no improvement       Follow up plan: Return if symptoms worsen or fail to improve.

## 2016-11-06 DIAGNOSIS — L820 Inflamed seborrheic keratosis: Secondary | ICD-10-CM | POA: Diagnosis not present

## 2016-11-06 DIAGNOSIS — Z85828 Personal history of other malignant neoplasm of skin: Secondary | ICD-10-CM | POA: Diagnosis not present

## 2016-11-06 DIAGNOSIS — L570 Actinic keratosis: Secondary | ICD-10-CM | POA: Diagnosis not present

## 2016-11-10 ENCOUNTER — Other Ambulatory Visit: Payer: Self-pay | Admitting: Family Medicine

## 2016-11-10 DIAGNOSIS — G4730 Sleep apnea, unspecified: Secondary | ICD-10-CM

## 2016-11-11 NOTE — Telephone Encounter (Signed)
Your patient 

## 2016-11-17 DIAGNOSIS — G4733 Obstructive sleep apnea (adult) (pediatric): Secondary | ICD-10-CM | POA: Diagnosis not present

## 2016-11-17 NOTE — Progress Notes (Signed)
Cardiology Office Note  Date:  11/18/2016   ID:  Devin Adams, DOB [birth year], MRN [id number]  PCP:  Guadalupe Maple, MD   Chief Complaint  Patient presents with  . other    12 month follow up. Meds reviewed by the pt. verbally. "doing well."     HPI:  Devin Adams is a  66 -year-old gentleman  with a history of  hyperlipidemia,   Sleep apnea, uses nasal pillow low testosterone, shortness of breath with heavy exertion with previous stress test in 2008  who presents for routine followup of his hyperlipidemia and SOB  denies having any significant symptoms of chest discomfort Active at baseline, working in his garden Weight is elevated but stable  Lab work reviewed with him showing total cholesterol 180 range Glucose around 100 Labs reviewed with him in detail No regular exercise program  EKG shows normal sinus rhythm with rate 62 bpm, no significant ST or T-wave changes  Other past medical history reviewed CT scan images reviewed with him that he had for kidney stone This shows no significant PAD extending from descending aorta thoracic region down to his femoral arteries. Distal RCA with no coronary calcification  Notes indicate that his stress test 2008 was evaluated by Dr. Verl Blalock and Dr. Percival Spanish at [place] and it was not felt that he needed further workup such as a cardiac catheterization. The inferior wall was initially read as showing some reversible changes the evaluation of static images by the above doctors did not suggest ischemia. No cardiac catheterization was performed and he has done well since then.  Mom had pacer, dad with pacer   PMH:   has a past medical history of Enlarged prostate; History of kidney stones; Hyperlipidemia; Insomnia, unspecified; Kidney stone; Other testicular hypofunction; and Sleep apnea.  PSH:    Past Surgical History:  Procedure Laterality Date  . APPENDECTOMY    . COLONOSCOPY WITH PROPOFOL N/A 06/16/2016   Procedure:  COLONOSCOPY WITH PROPOFOL;  Surgeon: Lucilla Lame, MD;  Location: [facility];  Service: Endoscopy;  Laterality: N/A;  sleep apnea  . ESOPHAGEAL DILATION    . LASER OF PROSTATE W/ GREEN LIGHT PVP    . POLYPECTOMY  06/16/2016   Procedure: POLYPECTOMY;  Surgeon: Lucilla Lame, MD;  Location: [facility];  Service: Endoscopy;;  . THYROID CYST EXCISION      Current Outpatient Prescriptions  Medication Sig Dispense Refill  . aspirin 81 MG tablet Take 81 mg by mouth daily.      Marland Kitchen CIALIS 5 MG tablet Take 5 mg by mouth daily.    Marland Kitchen doxycycline (VIBRA-TABS) 100 MG tablet Take 1 tablet (100 mg total) by mouth 2 (two) times daily. 14 tablet 0  . gabapentin (NEURONTIN) 300 MG capsule Take 2 capsules (600 mg total) by mouth at bedtime. 180 capsule 4  . lovastatin (MEVACOR) 40 MG tablet Take 1 tablet (40 mg total) by mouth at bedtime. 30 tablet 1  . mometasone (NASONEX) 50 MCG/ACT nasal spray Place 2 sprays into the nose daily. 17 g 12  . Multiple Vitamin (MULTIVITAMIN) tablet Take 1 tablet by mouth daily.    . Omega-3 Fatty Acids (FISH OIL PO) Take by mouth daily.    . tamsulosin (FLOMAX) 0.4 MG CAPS capsule Take 1 capsule (0.4 mg total) by mouth daily. 90 capsule 4  . venlafaxine XR (EFFEXOR-XR) 75 MG 24 hr capsule Take 1 capsule (75 mg total) by mouth daily with breakfast. 90 capsule 4  No current facility-administered medications for this visit.      Allergies:   Patient has no known allergies.   Social History:  The patient  reports that he has never smoked. He has never used smokeless tobacco. He reports that he drinks about 1.8 oz of alcohol per week . He reports that he does not use drugs.   Family History:   family history includes Heart failure in his unknown relative.    Review of Systems: Review of Systems  Constitutional: Negative.   Respiratory: Negative.   Cardiovascular: Negative.   Gastrointestinal: Negative.   Musculoskeletal: Negative.   Neurological: Negative.    Psychiatric/Behavioral: Negative.   All other systems reviewed and are negative.    PHYSICAL EXAM: VS:  BP 140/82 (BP Location: Left Arm, Patient Position: Sitting, Cuff Size: Normal)   Pulse 62   Ht 5\' 10"  (1.778 m)   Wt 218 lb 8 oz (99.1 kg)   BMI 31.35 kg/m  , BMI Body mass index is 31.35 kg/m. GEN: Well nourished, well developed, in no acute distress  HEENT: normal  Neck: no JVD, carotid bruits, or masses Cardiac: RRR; no murmurs, rubs, or gallops,no edema  Respiratory:  clear to auscultation bilaterally, normal work of breathing GI: soft, nontender, nondistended, + BS MS: no deformity or atrophy  Skin: warm and dry, no rash Neuro:  Strength and sensation are intact Psych: euthymic mood, full affect    Recent Labs: 05/28/2016: ALT 35; BUN 17; Creatinine, Ser 1.04; Hemoglobin 14.2; Platelets 309; Potassium 4.2; Sodium 141; TSH 2.330    Lipid Panel Lab Results  Component Value Date   CHOL 167 05/28/2016   HDL 45 05/28/2016   LDLCALC 92 05/28/2016   TRIG 151 (H) 05/28/2016      Wt Readings from Last 3 Encounters:  11/18/16 218 lb 8 oz (99.1 kg)  10/20/16 221 lb (100.2 kg)  06/16/16 214 lb (97.1 kg)       ASSESSMENT AND PLAN:  SOB (shortness of breath) - Plan: EKG 12-Lead Secondary to heat and humidity this summer No significant symptoms on moderate exertion Likely conditioning and weight, no further workup needed  Mixed hyperlipidemia - Plan: EKG 12-Lead Recommended he stay on his cholesterol medication, preventative therapy  Sleep apnea, unspecified type - Plan: EKG 12-Lead Reports he is compliant with his nasal pillow  Discussion about preventive health, exercise, diet Feels his primary care's retiring would like to establish with new primary care, names provided Disposition:   F/U  12 months as needed   Total encounter time more than 25 minutes  Greater than 50% was spent in counseling and coordination of care with the patient    Orders  Placed This Encounter  Procedures  . EKG 12-Lead     Signed, Esmond Plants, M.D., Ph.D. 11/18/2016  [place], [place]

## 2016-11-18 ENCOUNTER — Encounter: Payer: Self-pay | Admitting: Cardiovascular Disease

## 2016-11-18 ENCOUNTER — Other Ambulatory Visit: Payer: Self-pay | Admitting: Family Medicine

## 2016-11-18 ENCOUNTER — Ambulatory Visit (INDEPENDENT_AMBULATORY_CARE_PROVIDER_SITE_OTHER): Payer: 59 | Admitting: Cardiovascular Disease

## 2016-11-18 VITALS — BP 140/82 | HR 62 | Ht 70.0 in | Wt 218.5 lb

## 2016-11-18 DIAGNOSIS — E782 Mixed hyperlipidemia: Secondary | ICD-10-CM | POA: Diagnosis not present

## 2016-11-18 DIAGNOSIS — R0602 Shortness of breath: Secondary | ICD-10-CM | POA: Diagnosis not present

## 2016-11-18 DIAGNOSIS — G4730 Sleep apnea, unspecified: Secondary | ICD-10-CM

## 2016-11-18 NOTE — Telephone Encounter (Signed)
Your patient 

## 2016-11-18 NOTE — Patient Instructions (Addendum)

## 2017-01-14 DIAGNOSIS — Z23 Encounter for immunization: Secondary | ICD-10-CM | POA: Diagnosis not present

## 2017-04-15 DIAGNOSIS — G4733 Obstructive sleep apnea (adult) (pediatric): Secondary | ICD-10-CM | POA: Diagnosis not present

## 2017-04-28 ENCOUNTER — Other Ambulatory Visit: Payer: Self-pay | Admitting: Family Medicine

## 2017-04-28 DIAGNOSIS — G4730 Sleep apnea, unspecified: Secondary | ICD-10-CM

## 2017-05-30 ENCOUNTER — Other Ambulatory Visit: Payer: Self-pay | Admitting: Family Medicine

## 2017-05-30 DIAGNOSIS — G4730 Sleep apnea, unspecified: Secondary | ICD-10-CM

## 2017-06-01 NOTE — Telephone Encounter (Signed)
No recent OV for patient / Next appt scheduled for 07-08-2017 / Refill request for Gabapentin

## 2017-06-07 ENCOUNTER — Other Ambulatory Visit: Payer: Self-pay | Admitting: Family Medicine

## 2017-06-08 NOTE — Telephone Encounter (Signed)
LOV 05/28/16 with Dr. Jeananne Rama.  Patient had an acute visit on 10/20/16 with Merrie Roof. / Next OV 07/08/2017 / Refill request for effexor / Will refill 1 month supply and advise patient he needs an OV

## 2017-07-08 ENCOUNTER — Encounter: Payer: Self-pay | Admitting: Family Medicine

## 2017-07-08 ENCOUNTER — Ambulatory Visit (INDEPENDENT_AMBULATORY_CARE_PROVIDER_SITE_OTHER): Payer: 59 | Admitting: Family Medicine

## 2017-07-08 VITALS — BP 132/80 | HR 87 | Ht 69.69 in | Wt 226.0 lb

## 2017-07-08 DIAGNOSIS — N401 Enlarged prostate with lower urinary tract symptoms: Secondary | ICD-10-CM

## 2017-07-08 DIAGNOSIS — E7849 Other hyperlipidemia: Secondary | ICD-10-CM | POA: Diagnosis not present

## 2017-07-08 DIAGNOSIS — G4730 Sleep apnea, unspecified: Secondary | ICD-10-CM | POA: Diagnosis not present

## 2017-07-08 DIAGNOSIS — Z125 Encounter for screening for malignant neoplasm of prostate: Secondary | ICD-10-CM | POA: Diagnosis not present

## 2017-07-08 DIAGNOSIS — R252 Cramp and spasm: Secondary | ICD-10-CM | POA: Diagnosis not present

## 2017-07-08 DIAGNOSIS — R350 Frequency of micturition: Secondary | ICD-10-CM

## 2017-07-08 DIAGNOSIS — R7301 Impaired fasting glucose: Secondary | ICD-10-CM

## 2017-07-08 DIAGNOSIS — Z0000 Encounter for general adult medical examination without abnormal findings: Secondary | ICD-10-CM

## 2017-07-08 DIAGNOSIS — Z7189 Other specified counseling: Secondary | ICD-10-CM | POA: Diagnosis not present

## 2017-07-08 DIAGNOSIS — Z1329 Encounter for screening for other suspected endocrine disorder: Secondary | ICD-10-CM

## 2017-07-08 DIAGNOSIS — F3342 Major depressive disorder, recurrent, in full remission: Secondary | ICD-10-CM | POA: Diagnosis not present

## 2017-07-08 DIAGNOSIS — G4762 Sleep related leg cramps: Secondary | ICD-10-CM | POA: Diagnosis not present

## 2017-07-08 LAB — URINALYSIS, ROUTINE W REFLEX MICROSCOPIC
Bilirubin, UA: NEGATIVE
Glucose, UA: NEGATIVE
Ketones, UA: NEGATIVE
LEUKOCYTES UA: NEGATIVE
NITRITE UA: NEGATIVE
PH UA: 6 (ref 5.0–7.5)
Protein, UA: NEGATIVE
RBC, UA: NEGATIVE
Specific Gravity, UA: 1.015 (ref 1.005–1.030)
UUROB: 0.2 mg/dL (ref 0.2–1.0)

## 2017-07-08 MED ORDER — VENLAFAXINE HCL ER 75 MG PO CP24: 75.0000 mg | ORAL_CAPSULE | Freq: Every day | ORAL | 4 refills | Status: DC

## 2017-07-08 MED ORDER — LOVASTATIN 40 MG PO TABS: 40.0000 mg | ORAL_TABLET | Freq: Every day | ORAL | 4 refills | Status: DC

## 2017-07-08 MED ORDER — TAMSULOSIN HCL 0.4 MG PO CAPS: 0.4000 mg | ORAL_CAPSULE | Freq: Every day | ORAL | 4 refills | Status: DC

## 2017-07-08 MED ORDER — GABAPENTIN 300 MG PO CAPS: 900.0000 mg | ORAL_CAPSULE | Freq: Every day | ORAL | 4 refills | Status: DC

## 2017-07-08 NOTE — Progress Notes (Signed)
BP 132/80 (BP Location: Left Arm)   Pulse 87   Ht 5' 9.69" (1.77 m)   Wt 226 lb (102.5 kg)   SpO2 98%   BMI 32.72 kg/m    Subjective:    Patient ID: Devin Adams, male    DOB: April 26, 1950, 67 y.o.   MRN: [id number]  HPI: Devin Adams is a 67 y.o. male  Chief Complaint  Patient presents with  . Annual Exam  Patient with several concerns. Has nocturnal leg cramps right vastus medialis area of quadriceps.  This comes on after a heavy day of manual labor at home.  Does not happen with work as does not do significant manual labor.  This will wake him up in the middle of the night goes away with stretching walking around and drinking some pickle juice.  Patient's also noticed slight head tremor shaking bobbing nodding kind of things no tremor of hands.  Patient's father also had a similar head shaking tremor in  his elderly years. Other medical conditions are stable taking venlafaxine without issues. BPH well controlled with tamsulosin. Cholesterol doing okay with Mevacor. Gabapentin is helping with sleep.   Relevant past medical, surgical, family and social history reviewed and updated as indicated. Interim medical history since our last visit reviewed. Allergies and medications reviewed and updated.  Review of Systems  Constitutional: Negative.   HENT: Negative.   Eyes: Negative.   Respiratory: Negative.   Cardiovascular: Negative.   Gastrointestinal: Negative.   Endocrine: Negative.   Genitourinary: Negative.   Musculoskeletal: Negative.   Skin: Negative.   Allergic/Immunologic: Negative.   Neurological: Negative.   Hematological: Negative.   Psychiatric/Behavioral: Negative.     Per HPI unless specifically indicated above     Objective:    BP 132/80 (BP Location: Left Arm)   Pulse 87   Ht 5' 9.69" (1.77 m)   Wt 226 lb (102.5 kg)   SpO2 98%   BMI 32.72 kg/m   Wt Readings from Last 3 Encounters:  07/08/17 226 lb (102.5 kg)  11/18/16 218 lb 8 oz  (99.1 kg)  10/20/16 221 lb (100.2 kg)    Physical Exam  Constitutional: He is oriented to person, place, and time. He appears well-developed and well-nourished.  HENT:  Head: Normocephalic and atraumatic.  Right Ear: External ear normal.  Left Ear: External ear normal.  Eyes: Pupils are equal, round, and reactive to light. Conjunctivae and EOM are normal.  Neck: Normal range of motion. Neck supple.  Cardiovascular: Normal rate, regular rhythm, normal heart sounds and intact distal pulses.  Pulmonary/Chest: Effort normal and breath sounds normal.  Abdominal: Soft. Bowel sounds are normal. There is no splenomegaly or hepatomegaly.  Genitourinary: Rectum normal, prostate normal and penis normal.  Musculoskeletal: Normal range of motion.  Neurological: He is alert and oriented to person, place, and time. He has normal reflexes.  Skin: No rash noted. No erythema.  Psychiatric: He has a normal mood and affect. His behavior is normal. Judgment and thought content normal.    Results for orders placed or performed in visit on 05/28/16  CBC with Differential/Platelet  Result Value Ref Range   WBC 6.0 3.4 - 10.8 x10E3/uL   RBC 4.68 4.14 - 5.80 x10E6/uL   Hemoglobin 14.2 13.0 - 17.7 g/dL   Hematocrit 41.4 37.5 - 51.0 %   MCV 89 79 - 97 fL   MCH 30.3 26.6 - 33.0 pg   MCHC 34.3 31.5 - 35.7 g/dL   RDW  13.2 12.3 - 15.4 %   Platelets 309 150 - 379 x10E3/uL   Neutrophils 60 Not Estab. %   Lymphs 23 Not Estab. %   Monocytes 8 Not Estab. %   Eos 8 Not Estab. %   Basos 1 Not Estab. %   Neutrophils Absolute 3.6 1.4 - 7.0 x10E3/uL   Lymphocytes Absolute 1.4 0.7 - 3.1 x10E3/uL   Monocytes Absolute 0.5 0.1 - 0.9 x10E3/uL   EOS (ABSOLUTE) 0.5 (H) 0.0 - 0.4 x10E3/uL   Basophils Absolute 0.0 0.0 - 0.2 x10E3/uL   Immature Granulocytes 0 Not Estab. %   Immature Grans (Abs) 0.0 0.0 - 0.1 x10E3/uL  Comprehensive metabolic panel  Result Value Ref Range   Glucose 99 65 - 99 mg/dL   BUN 17 8 - 27 mg/dL     Creatinine, Ser 1.04 0.76 - 1.27 mg/dL   GFR calc non Af Amer 74 >59 mL/min/1.73   GFR calc Af Amer 86 >59 mL/min/1.73   BUN/Creatinine Ratio 16 10 - 24   Sodium 141 134 - 144 mmol/L   Potassium 4.2 3.5 - 5.2 mmol/L   Chloride 100 96 - 106 mmol/L   CO2 23 18 - 29 mmol/L   Calcium 9.4 8.6 - 10.2 mg/dL   Total Protein 6.7 6.0 - 8.5 g/dL   Albumin 4.4 3.6 - 4.8 g/dL   Globulin, Total 2.3 1.5 - 4.5 g/dL   Albumin/Globulin Ratio 1.9 1.2 - 2.2   Bilirubin Total 0.4 0.0 - 1.2 mg/dL   Alkaline Phosphatase 90 39 - 117 IU/L   AST 27 0 - 40 IU/L   ALT 35 0 - 44 IU/L  Lipid panel  Result Value Ref Range   Cholesterol, Total 167 100 - 199 mg/dL   Triglycerides 151 (H) 0 - 149 mg/dL   HDL 45 >39 mg/dL   VLDL Cholesterol Cal 30 5 - 40 mg/dL   LDL Calculated 92 0 - 99 mg/dL   Chol/HDL Ratio 3.7 0.0 - 5.0 ratio units  TSH  Result Value Ref Range   TSH 2.330 0.450 - 4.500 uIU/mL  PSA  Result Value Ref Range   Prostate Specific Ag, Serum 1.0 0.0 - 4.0 ng/mL  Urinalysis, Routine w reflex microscopic  Result Value Ref Range   Specific Gravity, UA >1.030 (H) 1.005 - 1.030   pH, UA 5.0 5.0 - 7.5   Color, UA Yellow Yellow   Appearance Ur Clear Clear   Leukocytes, UA Negative Negative   Protein, UA Negative Negative/Trace   Glucose, UA Negative Negative   Ketones, UA Negative Negative   RBC, UA Negative Negative   Bilirubin, UA Negative Negative   Urobilinogen, Ur 0.2 0.2 - 1.0 mg/dL   Nitrite, UA Negative Negative      Assessment & Plan:   Problem List Items Addressed This Visit      Respiratory   Sleep apnea   Relevant Medications   gabapentin (NEURONTIN) 300 MG capsule     Genitourinary   BPH (benign prostatic hyperplasia)    The current medical regimen is effective;  continue present plan and medications.       Relevant Medications   tamsulosin (FLOMAX) 0.4 MG CAPS capsule     Other   Hyperlipidemia - Primary    The current medical regimen is effective;  continue  present plan and medications.       Relevant Medications   lovastatin (MEVACOR) 40 MG tablet   Other Relevant Orders   CBC with Differential/Platelet  Comprehensive metabolic panel   Lipid panel   Urinalysis, Routine w reflex microscopic   Advanced care planning/counseling discussion    A voluntary discussion about advance care planning including the explanation and discussion of advance directives was extensively discussed  with the patient.  Explanation about the health care proxy and Living will was reviewed and packet with forms with explanation of how to fill them out was given.  Time spent: encounter 16+ min       Individuals present. Pt       Elevated fasting glucose   Relevant Orders   CBC with Differential/Platelet   Comprehensive metabolic panel   Lipid panel   Urinalysis, Routine w reflex microscopic   Depression    The current medical regimen is effective;  continue present plan and medications.       Relevant Medications   venlafaxine XR (EFFEXOR-XR) 75 MG 24 hr capsule   Nocturnal leg cramps    Check Mg and discuss care and tx       Other Visit Diagnoses    Annual physical exam       Relevant Orders   CBC with Differential/Platelet   Comprehensive metabolic panel   Lipid panel   PSA   TSH   Urinalysis, Routine w reflex microscopic   Prostate cancer screening       Relevant Orders   PSA   Thyroid disorder screen       Relevant Orders   TSH   Cramps of left lower extremity       Relevant Orders   Magnesium       Follow up plan: No follow-ups on file.

## 2017-07-08 NOTE — Assessment & Plan Note (Signed)
Check Mg and discuss care and tx

## 2017-07-08 NOTE — Assessment & Plan Note (Signed)
The current medical regimen is effective;  continue present plan and medications.  

## 2017-07-08 NOTE — Assessment & Plan Note (Signed)
A voluntary discussion about advance care planning including the explanation and discussion of advance directives was extensively discussed  with the patient.  Explanation about the health care proxy and Living will was reviewed and packet with forms with explanation of how to fill them out was given.  Time spent: encounter 16+ min       Individuals present: Pt.  

## 2017-07-09 ENCOUNTER — Encounter: Payer: Self-pay | Admitting: Family Medicine

## 2017-07-09 DIAGNOSIS — Z85828 Personal history of other malignant neoplasm of skin: Secondary | ICD-10-CM | POA: Diagnosis not present

## 2017-07-09 DIAGNOSIS — D2262 Melanocytic nevi of left upper limb, including shoulder: Secondary | ICD-10-CM | POA: Diagnosis not present

## 2017-07-09 DIAGNOSIS — D2261 Melanocytic nevi of right upper limb, including shoulder: Secondary | ICD-10-CM | POA: Diagnosis not present

## 2017-07-09 DIAGNOSIS — L570 Actinic keratosis: Secondary | ICD-10-CM | POA: Diagnosis not present

## 2017-07-09 DIAGNOSIS — X32XXXA Exposure to sunlight, initial encounter: Secondary | ICD-10-CM | POA: Diagnosis not present

## 2017-07-09 LAB — COMPREHENSIVE METABOLIC PANEL
ALT: 26 IU/L (ref 0–44)
AST: 21 IU/L (ref 0–40)
Albumin/Globulin Ratio: 2.1 (ref 1.2–2.2)
Albumin: 4.6 g/dL (ref 3.6–4.8)
Alkaline Phosphatase: 76 IU/L (ref 39–117)
BUN/Creatinine Ratio: 20 (ref 10–24)
BUN: 19 mg/dL (ref 8–27)
Bilirubin Total: 0.4 mg/dL (ref 0.0–1.2)
CALCIUM: 9.5 mg/dL (ref 8.6–10.2)
CHLORIDE: 103 mmol/L (ref 96–106)
CO2: 23 mmol/L (ref 20–29)
Creatinine, Ser: 0.97 mg/dL (ref 0.76–1.27)
GFR, EST AFRICAN AMERICAN: 93 mL/min/{1.73_m2} (ref >59)
GFR, EST NON AFRICAN AMERICAN: 80 mL/min/{1.73_m2} (ref >59)
GLUCOSE: 101 mg/dL — AB (ref 65–99)
Globulin, Total: 2.2 g/dL (ref 1.5–4.5)
POTASSIUM: 4.5 mmol/L (ref 3.5–5.2)
Sodium: 142 mmol/L (ref 134–144)
TOTAL PROTEIN: 6.8 g/dL (ref 6.0–8.5)

## 2017-07-09 LAB — CBC WITH DIFFERENTIAL/PLATELET
BASOS ABS: 0 10*3/uL (ref 0.0–0.2)
Basos: 1 %
EOS (ABSOLUTE): 0.2 10*3/uL (ref 0.0–0.4)
Eos: 4 %
Hematocrit: 41.4 % (ref 37.5–51.0)
Hemoglobin: 14.3 g/dL (ref 13.0–17.7)
IMMATURE GRANS (ABS): 0 10*3/uL (ref 0.0–0.1)
IMMATURE GRANULOCYTES: 0 %
LYMPHS: 23 %
Lymphocytes Absolute: 1.4 10*3/uL (ref 0.7–3.1)
MCH: 31.2 pg (ref 26.6–33.0)
MCHC: 34.5 g/dL (ref 31.5–35.7)
MCV: 90 fL (ref 79–97)
MONOS ABS: 0.6 10*3/uL (ref 0.1–0.9)
Monocytes: 10 %
NEUTROS PCT: 62 %
Neutrophils Absolute: 3.8 10*3/uL (ref 1.4–7.0)
PLATELETS: 292 10*3/uL (ref 150–379)
RBC: 4.59 x10E6/uL (ref 4.14–5.80)
RDW: 13.3 % (ref 12.3–15.4)
WBC: 6.1 10*3/uL (ref 3.4–10.8)

## 2017-07-09 LAB — LIPID PANEL
CHOL/HDL RATIO: 3.8 ratio (ref 0.0–5.0)
Cholesterol, Total: 162 mg/dL (ref 100–199)
HDL: 43 mg/dL (ref >39)
LDL Calculated: 70 mg/dL (ref 0–99)
TRIGLYCERIDES: 245 mg/dL — AB (ref 0–149)
VLDL CHOLESTEROL CAL: 49 mg/dL — AB (ref 5–40)

## 2017-07-09 LAB — TSH: TSH: 1.8 u[IU]/mL (ref 0.450–4.500)

## 2017-07-09 LAB — MAGNESIUM: Magnesium: 2.1 mg/dL (ref 1.6–2.3)

## 2017-07-09 LAB — PSA: PROSTATE SPECIFIC AG, SERUM: 0.9 ng/mL (ref 0.0–4.0)

## 2017-08-12 ENCOUNTER — Encounter: Payer: Self-pay | Admitting: Cardiovascular Disease

## 2017-08-12 NOTE — Telephone Encounter (Signed)
This encounter was created in error - please disregard.

## 2017-09-25 ENCOUNTER — Telehealth: Payer: Self-pay | Admitting: Family Medicine

## 2017-09-25 DIAGNOSIS — F3342 Major depressive disorder, recurrent, in full remission: Secondary | ICD-10-CM

## 2017-09-25 NOTE — Telephone Encounter (Signed)
Copied from [place] [phone] [id number]. Topic: Quick Communication - Rx Refill/Question >> Sep 25, 2017  8:52 AM Boyd Kerbs wrote: Medication:  venlafaxine XR (EFFEXOR-XR) 75 MG 24 hr capsule Asking for 90 days supply  He only has 2 pills left  Has the patient contacted their pharmacy? No. (Agent: If no, request that the patient contact the pharmacy for the refill.) (Agent: If yes, when and what did the pharmacy advise?)  Preferred Pharmacy (with phone number or street name):   [place], [facility] [address] [place] [address] [place] [address] Phone: [phone] Fax: [phone]    Agent: Please be advised that RX refills may take up to 3 business days. We ask that you follow-up with your pharmacy.

## 2017-09-28 MED ORDER — VENLAFAXINE HCL ER 75 MG PO CP24: 75.0000 mg | ORAL_CAPSULE | Freq: Every day | ORAL | 4 refills | Status: DC

## 2017-09-28 NOTE — Telephone Encounter (Signed)
Rx is also at [place]- can pick that up until comes from [place]

## 2017-10-26 ENCOUNTER — Ambulatory Visit (INDEPENDENT_AMBULATORY_CARE_PROVIDER_SITE_OTHER): Payer: 59

## 2017-10-26 ENCOUNTER — Other Ambulatory Visit: Payer: Self-pay | Admitting: Family Medicine

## 2017-10-26 ENCOUNTER — Encounter: Payer: Self-pay | Admitting: Family Medicine

## 2017-10-26 ENCOUNTER — Ambulatory Visit (INDEPENDENT_AMBULATORY_CARE_PROVIDER_SITE_OTHER): Payer: 59 | Admitting: Family Medicine

## 2017-10-26 VITALS — BP 131/82 | HR 62 | Resp 16 | Ht 70.0 in | Wt 226.0 lb

## 2017-10-26 VITALS — BP 131/82 | HR 62 | Wt 226.0 lb

## 2017-10-26 DIAGNOSIS — R945 Abnormal results of liver function studies: Secondary | ICD-10-CM | POA: Diagnosis not present

## 2017-10-26 DIAGNOSIS — R7989 Other specified abnormal findings of blood chemistry: Secondary | ICD-10-CM | POA: Diagnosis not present

## 2017-10-26 DIAGNOSIS — Z0000 Encounter for general adult medical examination without abnormal findings: Secondary | ICD-10-CM | POA: Diagnosis not present

## 2017-10-26 MED ORDER — MOMETASONE FUROATE 50 MCG/ACT NA SUSP: 2.0000 | Freq: Two times a day (BID) | NASAL | 12 refills | Status: DC

## 2017-10-26 MED ORDER — CETIRIZINE HCL 10 MG PO TABS: 10.0000 mg | ORAL_TABLET | Freq: Every day | ORAL | 11 refills | Status: DC

## 2017-10-26 NOTE — Progress Notes (Signed)
BP 131/82   Pulse 62   Wt 226 lb (102.5 kg)   SpO2 98%   BMI 32.72 kg/m    Subjective:    Patient ID: Wayne Both, male    DOB: 04/22/1950, 67 y.o.   MRN: [id number]  HPI: JUANMANUEL MAROHL is a 67 y.o. male  Chief Complaint  Patient presents with  . Abnormal Lab    Recent life insurance labs - liver function abnormal   Pt here today to discuss blood work done privately for life insurance application 2 weeks ago. Significantly elevated LFts and Alk Phos were revealed, as well as an elevated BNP. Last labs otherwise were 4 months ago, LFTs WNL at that time. No recent flu like illnesses, fever, chills, aches, abdominal pain, SOB, CP, orthopnea. Pt feels in his usual state of health. Denies any phx of drinking to excess or tylenol use. Recently cruised to the [place] but otherwise no travel or consumption of exotic foods.   Relevant past medical, surgical, family and social history reviewed and updated as indicated. Interim medical history since our last visit reviewed. Allergies and medications reviewed and updated.  Review of Systems  Per HPI unless specifically indicated above     Objective:    BP 131/82   Pulse 62   Wt 226 lb (102.5 kg)   SpO2 98%   BMI 32.72 kg/m   Wt Readings from Last 3 Encounters:  10/26/17 226 lb (102.5 kg)  10/26/17 226 lb (102.5 kg)  07/08/17 226 lb (102.5 kg)    Physical Exam  Constitutional: He is oriented to person, place, and time. He appears well-developed and well-nourished. No distress.  HENT:  Head: Atraumatic.  Eyes: Pupils are equal, round, and reactive to light. Conjunctivae and EOM are normal.  Neck: Normal range of motion. Neck supple.  Cardiovascular: Normal rate and regular rhythm.  Pulmonary/Chest: Effort normal and breath sounds normal.  Abdominal: Soft. Bowel sounds are normal.  Musculoskeletal: Normal range of motion.  Lymphadenopathy:    He has no cervical adenopathy.  Neurological: He is alert and oriented to  person, place, and time.  Skin: Skin is warm and dry.  Psychiatric:  Very anxious about his results  Nursing note and vitals reviewed.   Results for orders placed or performed in visit on 10/26/17  Hepatitis panel, acute  Result Value Ref Range   Hep A IgM Negative Negative   Hepatitis B Surface Ag Negative Negative   Hep B C IgM Negative Negative   Hep C Virus Ab <0.1 0.0 - 0.9 s/co ratio  CMV IgM  Result Value Ref Range   CMV IgM Ser EIA-aCnc <30.0 0.0 - 29.9 AU/mL  EBV ab to viral capsid ag pnl, IgG+IgM  Result Value Ref Range   EBV VCA IgM WILL FOLLOW    EBV VCA IgG WILL FOLLOW   Pro b natriuretic peptide  Result Value Ref Range   NT-Pro BNP 109 0 - 376 pg/mL  Comprehensive metabolic panel  Result Value Ref Range   Glucose 94 65 - 99 mg/dL   BUN 16 8 - 27 mg/dL   Creatinine, Ser 1.06 0.76 - 1.27 mg/dL   GFR calc non Af Amer 72 >59 mL/min/1.73   GFR calc Af Amer 84 >59 mL/min/1.73   BUN/Creatinine Ratio 15 10 - 24   Sodium 143 134 - 144 mmol/L   Potassium 5.0 3.5 - 5.2 mmol/L   Chloride 107 (H) 96 - 106 mmol/L   CO2 21  20 - 29 mmol/L   Calcium 9.2 8.6 - 10.2 mg/dL   Total Protein 6.2 6.0 - 8.5 g/dL   Albumin 4.3 3.6 - 4.8 g/dL   Globulin, Total 1.9 1.5 - 4.5 g/dL   Albumin/Globulin Ratio 2.3 (H) 1.2 - 2.2   Bilirubin Total 0.4 0.0 - 1.2 mg/dL   Alkaline Phosphatase 90 39 - 117 IU/L   AST 24 0 - 40 IU/L   ALT 29 0 - 44 IU/L      Assessment & Plan:   Problem List Items Addressed This Visit    None    Visit Diagnoses    Elevated brain natriuretic peptide (BNP) level    -  Primary   Relevant Orders   Pro b natriuretic peptide (Completed)   Elevated LFTs       Relevant Orders   Hepatitis panel, acute (Completed)   CMV IgM (Completed)   EBV ab to viral capsid ag pnl, IgG+IgM (Completed)   Comprehensive metabolic panel (Completed)    Outside results scanned into pt's chart. Will repeat labs as well as run a hepatitis panel, CMV, and EBV tests. Pt well  appearing and in his usual state of health. Will provide referrals/further evaluation as needed based on his results.    Follow up plan: Return for as scheduled.

## 2017-10-26 NOTE — Patient Instructions (Addendum)
Devin Adams , Thank you for taking time to come for your Medicare Wellness Visit. I appreciate your ongoing commitment to your health goals. Please review the following plan we discussed and let me know if I can assist you in the future.   Screening recommendations/referrals: Colonoscopy: completed 06/16/2016 Recommended yearly ophthalmology/optometry visit for glaucoma screening and checkup Recommended yearly dental visit for hygiene and checkup  Vaccinations: Influenza vaccine: due 11/2017 Pneumococcal vaccine: up to date, completed series  Tdap vaccine: up to date Shingles vaccine: shingrix eligible, check with your insurance company for coverage     Advanced directives: Please bring a copy of your health care power of attorney and living will to the office at your convenience.  Conditions/risks identified: Recommend drinking at least 6-8 glasses of water a day   Next appointment: Follow up in one year for your annual wellness exam.   Preventive Care 68 Years and Older, Male Preventive care refers to lifestyle choices and visits with your health care provider that can promote health and wellness. What does preventive care include?  A yearly physical exam. This is also called an annual well check.  Dental exams once or twice a year.  Routine eye exams. Ask your health care provider how often you should have your eyes checked.  Personal lifestyle choices, including:  Daily care of your teeth and gums.  Regular physical activity.  Eating a healthy diet.  Avoiding tobacco and drug use.  Limiting alcohol use.  Practicing safe sex.  Taking low doses of aspirin every day.  Taking vitamin and mineral supplements as recommended by your health care provider. What happens during an annual well check? The services and screenings done by your health care provider during your annual well check will depend on your age, overall health, lifestyle risk factors, and family history of  disease. Counseling  Your health care provider may ask you questions about your:  Alcohol use.  Tobacco use.  Drug use.  Emotional well-being.  Home and relationship well-being.  Sexual activity.  Eating habits.  History of falls.  Memory and ability to understand (cognition).  Work and work Statistician. Screening  You may have the following tests or measurements:  Height, weight, and BMI.  Blood pressure.  Lipid and cholesterol levels. These may be checked every 5 years, or more frequently if you are over 13 years old.  Skin check.  Lung cancer screening. You may have this screening every year starting at age 68 if you have a 30-pack-year history of smoking and currently smoke or have quit within the past 15 years.  Fecal occult blood test (FOBT) of the stool. You may have this test every year starting at age 68.  Flexible sigmoidoscopy or colonoscopy. You may have a sigmoidoscopy every 5 years or a colonoscopy every 10 years starting at age 40.  Prostate cancer screening. Recommendations will vary depending on your family history and other risks.  Hepatitis C blood test.  Hepatitis B blood test.  Sexually transmitted disease (STD) testing.  Diabetes screening. This is done by checking your blood sugar (glucose) after you have not eaten for a while (fasting). You may have this done every 1-3 years.  Abdominal aortic aneurysm (AAA) screening. You may need this if you are a current or former smoker.  Osteoporosis. You may be screened starting at age 11 if you are at high risk. Talk with your health care provider about your test results, treatment options, and if necessary, the need for more  tests. Vaccines  Your health care provider may recommend certain vaccines, such as:  Influenza vaccine. This is recommended every year.  Tetanus, diphtheria, and acellular pertussis (Tdap, Td) vaccine. You may need a Td booster every 10 years.  Zoster vaccine. You may  need this after age 68.  Pneumococcal 13-valent conjugate (PCV13) vaccine. One dose is recommended after age 50.  Pneumococcal polysaccharide (PPSV23) vaccine. One dose is recommended after age 68. Talk to your health care provider about which screenings and vaccines you need and how often you need them. This information is not intended to replace advice given to you by your health care provider. Make sure you discuss any questions you have with your health care provider. Document Released: 04/13/2015 Document Revised: 12/05/2015 Document Reviewed: 01/16/2015 Elsevier Interactive Patient Education  2017 [place] Prevention in the Home Falls can cause injuries. They can happen to people of all ages. There are many things you can do to make your home safe and to help prevent falls. What can I do on the outside of my home?  Regularly fix the edges of walkways and driveways and fix any cracks.  Remove anything that might make you trip as you walk through a door, such as a raised step or threshold.  Trim any bushes or trees on the path to your home.  Use bright outdoor lighting.  Clear any walking paths of anything that might make someone trip, such as rocks or tools.  Regularly check to see if handrails are loose or broken. Make sure that both sides of any steps have handrails.  Any raised decks and porches should have guardrails on the edges.  Have any leaves, snow, or ice cleared regularly.  Use sand or salt on walking paths during winter.  Clean up any spills in your garage right away. This includes oil or grease spills. What can I do in the bathroom?  Use night lights.  Install grab bars by the toilet and in the tub and shower. Do not use towel bars as grab bars.  Use non-skid mats or decals in the tub or shower.  If you need to sit down in the shower, use a plastic, non-slip stool.  Keep the floor dry. Clean up any water that spills on the floor as soon as it  happens.  Remove soap buildup in the tub or shower regularly.  Attach bath mats securely with double-sided non-slip rug tape.  Do not have throw rugs and other things on the floor that can make you trip. What can I do in the bedroom?  Use night lights.  Make sure that you have a light by your bed that is easy to reach.  Do not use any sheets or blankets that are too big for your bed. They should not hang down onto the floor.  Have a firm chair that has side arms. You can use this for support while you get dressed.  Do not have throw rugs and other things on the floor that can make you trip. What can I do in the kitchen?  Clean up any spills right away.  Avoid walking on wet floors.  Keep items that you use a lot in easy-to-reach places.  If you need to reach something above you, use a strong step stool that has a grab bar.  Keep electrical cords out of the way.  Do not use floor polish or wax that makes floors slippery. If you must use wax, use non-skid floor  wax.  Do not have throw rugs and other things on the floor that can make you trip. What can I do with my stairs?  Do not leave any items on the stairs.  Make sure that there are handrails on both sides of the stairs and use them. Fix handrails that are broken or loose. Make sure that handrails are as long as the stairways.  Check any carpeting to make sure that it is firmly attached to the stairs. Fix any carpet that is loose or worn.  Avoid having throw rugs at the top or bottom of the stairs. If you do have throw rugs, attach them to the floor with carpet tape.  Make sure that you have a light switch at the top of the stairs and the bottom of the stairs. If you do not have them, ask someone to add them for you. What else can I do to help prevent falls?  Wear shoes that:  Do not have high heels.  Have rubber bottoms.  Are comfortable and fit you well.  Are closed at the toe. Do not wear sandals.  If you  use a stepladder:  Make sure that it is fully opened. Do not climb a closed stepladder.  Make sure that both sides of the stepladder are locked into place.  Ask someone to hold it for you, if possible.  Clearly mark and make sure that you can see:  Any grab bars or handrails.  First and last steps.  Where the edge of each step is.  Use tools that help you move around (mobility aids) if they are needed. These include:  Canes.  Walkers.  Scooters.  Crutches.  Turn on the lights when you go into a dark area. Replace any light bulbs as soon as they burn out.  Set up your furniture so you have a clear path. Avoid moving your furniture around.  If any of your floors are uneven, fix them.  If there are any pets around you, be aware of where they are.  Review your medicines with your doctor. Some medicines can make you feel dizzy. This can increase your chance of falling. Ask your doctor what other things that you can do to help prevent falls. This information is not intended to replace advice given to you by your health care provider. Make sure you discuss any questions you have with your health care provider. Document Released: 01/11/2009 Document Revised: 08/23/2015 Document Reviewed: 04/21/2014 Elsevier Interactive Patient Education  2017 [employer].

## 2017-10-26 NOTE — Progress Notes (Signed)
Subjective:   Devin Adams is a 67 y.o. male who presents for Medicare Annual/Subsequent preventive examination.  Review of Systems:  Cardiac Risk Factors include: advanced age (>29men, >30 women);obesity (BMI >30kg/m2);dyslipidemia;hypertension;male gender     Objective:    Vitals: BP 131/82 (BP Location: Left Arm, Patient Position: Sitting)   Pulse 62   Resp 16   Ht 5\' 10"  (1.778 m)   Wt 226 lb (102.5 kg)   SpO2 98%   BMI 32.43 kg/m   Body mass index is 32.43 kg/m.  Advanced Directives 10/26/2017 06/16/2016 02/18/2015  Does Patient Have a Medical Advance Directive? Yes Yes No  Type of Paramedic of [place];Living will [place] in Chart? No - copy requested Yes -    Tobacco Social History   Tobacco Use  Smoking Status Never Smoker  Smokeless Tobacco Never Used     Counseling given: Not Answered   Clinical Intake:  Pre-visit preparation completed: Yes  Pain : No/denies pain Pain Score: 0-No pain     Nutritional Status: BMI > 30  Obese Nutritional Risks: None Diabetes: No  How often do you need to have someone help you when you read instructions, pamphlets, or other written materials from your doctor or pharmacy?: 1 - Never What is the last grade level you completed in school?: 2 years tech school   Interpreter Needed?: No  Information entered by :: Chantal Worthey,LPN   Past Medical History:  Diagnosis Date  . Enlarged prostate   . History of kidney stones   . Hyperlipidemia   . Insomnia, unspecified   . Kidney stone   . Other testicular hypofunction   . Sleep apnea    CPAP   Past Surgical History:  Procedure Laterality Date  . APPENDECTOMY    . COLONOSCOPY WITH PROPOFOL N/A 06/16/2016   Procedure: COLONOSCOPY WITH PROPOFOL;  Surgeon: Lucilla Lame, MD;  Location: [facility];  Service: Endoscopy;  Laterality: N/A;  sleep apnea  . ESOPHAGEAL DILATION      . LASER OF PROSTATE W/ GREEN LIGHT PVP    . POLYPECTOMY  06/16/2016   Procedure: POLYPECTOMY;  Surgeon: Lucilla Lame, MD;  Location: [facility];  Service: Endoscopy;;  . THYROID CYST EXCISION     Family History  Problem Relation Age of Onset  . Heart failure Mother   . Heart failure Father   . Heart failure Unknown        CHF-family hx on mothers side  . Brain cancer Paternal Uncle   . Brain cancer Paternal Uncle    Social History   Socioeconomic History  . Marital status: Married    Spouse name: Not on file  . Number of children: Not on file  . Years of education: 2 years tech school   . Highest education level: Not on file  Occupational History  . Not on file  Social Needs  . Financial resource strain: Not hard at all  . Food insecurity:    Worry: Never true    Inability: Never true  . Transportation needs:    Medical: No    Non-medical: No  Tobacco Use  . Smoking status: Never Smoker  . Smokeless tobacco: Never Used  Substance and Sexual Activity  . Alcohol use: Yes    Alcohol/week: 1.8 oz    Types: 3 Cans of beer per week    Comment:    . Drug use: No  .  Sexual activity: Not on file  Lifestyle  . Physical activity:    Days per week: 0 days    Minutes per session: 0 min  . Stress: Not at all  Relationships  . Social connections:    Talks on phone: More than three times a week    Gets together: More than three times a week    Attends religious service: More than 4 times per year    Active member of club or organization: No    Attends meetings of clubs or organizations: Never    Relationship status: Married  Other Topics Concern  . Not on file  Social History Narrative   Working full time ; married; does not get regular exercise.    Outpatient Encounter Medications as of 10/26/2017  Medication Sig  . aspirin 81 MG tablet Take 81 mg by mouth daily.    Marland Kitchen CIALIS 5 MG tablet Take 5 mg by mouth daily.  Marland Kitchen gabapentin (NEURONTIN) 300 MG capsule Take 3  capsules (900 mg total) by mouth at bedtime.  . lovastatin (MEVACOR) 40 MG tablet Take 1 tablet (40 mg total) by mouth at bedtime.  . mometasone (NASONEX) 50 MCG/ACT nasal spray Place 2 sprays into the nose daily.  . Multiple Vitamin (MULTIVITAMIN) tablet Take 1 tablet by mouth daily.  . Omega-3 Fatty Acids (FISH OIL PO) Take by mouth daily.  . tamsulosin (FLOMAX) 0.4 MG CAPS capsule Take 1 capsule (0.4 mg total) by mouth daily.  Marland Kitchen venlafaxine XR (EFFEXOR-XR) 75 MG 24 hr capsule Take 1 capsule (75 mg total) by mouth daily with breakfast.   No facility-administered encounter medications on file as of 10/26/2017.     Activities of Daily Living In your present state of health, do you have any difficulty performing the following activities: 10/26/2017 10/26/2017  Hearing? N N  Vision? N N  Difficulty concentrating or making decisions? N N  Walking or climbing stairs? N N  Dressing or bathing? N N  Doing errands, shopping? N N  Preparing Food and eating ? N -  Using the Toilet? N -  In the past six months, have you accidently leaked urine? N -  Do you have problems with loss of bowel control? N -  Managing your Medications? N -  Managing your Finances? N -  Housekeeping or managing your Housekeeping? N -  Some recent data might be hidden    Patient Care Team: Guadalupe Maple, MD as PCP - General (Unknown Physician Specialty) Minna Merritts, MD as Consulting Physician (Cardiology)   Assessment:   This is a routine wellness examination for Devin Adams.  Exercise Activities and Dietary recommendations Current Exercise Habits: The patient does not participate in regular exercise at present, Exercise limited by: None identified  Goals    . DIET - INCREASE WATER INTAKE     Recommend drinking at least 6-8 glasses of water a day        Fall Risk Fall Risk  10/26/2017 10/26/2017 07/08/2017 05/28/2016 05/28/2015  Falls in the past year? No No No No No   Is the patient's home free of loose  throw rugs in walkways, pet beds, electrical cords, etc?   yes      Grab bars in the bathroom? no      Handrails on the stairs?   no stairs       Adequate lighting?   yes  Timed Get Up and Go Performed: Completed in 8 seconds with no use of assistive  devices, steady gait. No intervention needed at this time.   Depression Screen PHQ 2/9 Scores 10/26/2017 07/08/2017 05/28/2016 05/28/2015  PHQ - 2 Score 0 0 2 0  PHQ- 9 Score - - - -    Cognitive Function     6CIT Screen 10/26/2017  What Year? 0 points  What month? 0 points  What time? 0 points  Count back from 20 0 points  Months in reverse 0 points  Repeat phrase 0 points  Total Score 0    Immunization History  Administered Date(s) Administered  . Influenza,inj,Quad PF,6+ Mos 12/28/2014, 01/14/2017  . Influenza,inj,quad, With Preservative 12/30/2015  . Influenza-Unspecified 01/11/2016  . Pneumococcal Conjugate-13 05/28/2015  . Pneumococcal Polysaccharide-23 05/28/2016  . Pneumococcal-Unspecified 01/30/2015  . Tdap 01/31/2010    Qualifies for Shingles Vaccine? Yes, discussed shingrix vaccine   Screening Tests Health Maintenance  Topic Date Due  . INFLUENZA VACCINE  10/29/2017  . COLONOSCOPY  06/17/2019  . TETANUS/TDAP  02/01/2020  . Hepatitis C Screening  Completed  . PNA vac Low Risk Adult  Completed   Cancer Screenings: Lung: Low Dose CT Chest recommended if Age 64-80 years, 30 pack-year currently smoking OR have quit w/in 15years. Patient does not qualify. Colorectal: completed 06/16/2016  Additional Screenings: Hepatitis C Screening: completed 05/28/2015      Plan:    I have personally reviewed and addressed the Medicare Annual Wellness questionnaire and have noted the following in the patient's chart:  A. Medical and social history B. Use of alcohol, tobacco or illicit drugs  C. Current medications and supplements D. Functional ability and status E.  Nutritional status F.  Physical activity G. Advance  directives H. List of other physicians I.  Hospitalizations, surgeries, and ER visits in previous 12 months J.  [place] such as hearing and vision if needed, cognitive and depression L. Referrals and appointments   In addition, I have reviewed and discussed with patient certain preventive protocols, quality metrics, and best practice recommendations. A written personalized care plan for preventive services as well as general preventive health recommendations were provided to patient.   Signed,  Tyler Aas, LPN Nurse Health Advisor   Nurse Notes:none

## 2017-10-27 ENCOUNTER — Encounter: Payer: Self-pay | Admitting: Family Medicine

## 2017-10-27 NOTE — Telephone Encounter (Signed)
Please review for change requested

## 2017-10-27 NOTE — Patient Instructions (Signed)
Follow up as needed

## 2017-10-28 LAB — HEPATITIS PANEL, ACUTE
HEP A IGM: NEGATIVE
Hep B C IgM: NEGATIVE
Hepatitis B Surface Ag: NEGATIVE

## 2017-10-28 LAB — COMPREHENSIVE METABOLIC PANEL
A/G RATIO: 2.3 — AB (ref 1.2–2.2)
ALK PHOS: 90 IU/L (ref 39–117)
ALT: 29 IU/L (ref 0–44)
AST: 24 IU/L (ref 0–40)
Albumin: 4.3 g/dL (ref 3.6–4.8)
BILIRUBIN TOTAL: 0.4 mg/dL (ref 0.0–1.2)
BUN / CREAT RATIO: 15 (ref 10–24)
BUN: 16 mg/dL (ref 8–27)
CHLORIDE: 107 mmol/L — AB (ref 96–106)
CO2: 21 mmol/L (ref 20–29)
Calcium: 9.2 mg/dL (ref 8.6–10.2)
Creatinine, Ser: 1.06 mg/dL (ref 0.76–1.27)
GFR calc non Af Amer: 72 mL/min/{1.73_m2} (ref >59)
GFR, EST AFRICAN AMERICAN: 84 mL/min/{1.73_m2} (ref >59)
Globulin, Total: 1.9 g/dL (ref 1.5–4.5)
Glucose: 94 mg/dL (ref 65–99)
POTASSIUM: 5 mmol/L (ref 3.5–5.2)
Sodium: 143 mmol/L (ref 134–144)
TOTAL PROTEIN: 6.2 g/dL (ref 6.0–8.5)

## 2017-10-28 LAB — EBV AB TO VIRAL CAPSID AG PNL, IGG+IGM: EBV VCA IgG: 160 U/mL — ABNORMAL HIGH (ref 0.0–17.9)

## 2017-10-28 LAB — PRO B NATRIURETIC PEPTIDE: NT-Pro BNP: 109 pg/mL (ref 0–376)

## 2017-10-28 LAB — CMV IGM

## 2017-11-13 ENCOUNTER — Telehealth: Payer: Self-pay | Admitting: Family Medicine

## 2017-11-13 DIAGNOSIS — R748 Abnormal levels of other serum enzymes: Secondary | ICD-10-CM

## 2017-11-13 NOTE — Telephone Encounter (Signed)
Already addressed these during his OV when the labs were repeated and completely normal as well as called pt personally and sent a letter with repeated labs. Not sure why the labs were abnormal during the life insurance draw but if they would like them repeated a second time I'm happy to do so.   Copied from [place] [id number]. Topic: Inquiry >> Nov 13, 2017 11:05 AM Oliver Pila B wrote: Reason for CRM: pt's wife called to speak about the pt's latest blood work; they are needing information concerning the blood work w/ insurance company; contact pt to advise >> Nov 13, 2017  4:23 PM Reel, Rexene Edison, CMA wrote: Spoke with patient's wife, she is really concerned about the abnormal labs the patient got from [employer]. Please contact the patient's wife

## 2017-11-13 NOTE — Telephone Encounter (Signed)
Order placed

## 2017-11-13 NOTE — Telephone Encounter (Signed)
Spoke with patient's wife, patient's life insurance company is need a ggt result since it was elevated at the time of the draw.

## 2017-11-16 ENCOUNTER — Other Ambulatory Visit: Payer: 59

## 2017-11-16 DIAGNOSIS — R748 Abnormal levels of other serum enzymes: Secondary | ICD-10-CM

## 2017-11-16 NOTE — Telephone Encounter (Signed)
Called and left a voicemail letting patient's wife know that the orders have been placed.

## 2017-11-17 ENCOUNTER — Encounter: Payer: Self-pay | Admitting: Family Medicine

## 2017-11-17 LAB — GAMMA GT: GGT: 48 IU/L (ref 0–65)

## 2017-11-17 NOTE — Progress Notes (Signed)
Cardiology Office Note  Date:  11/18/2017   ID:  Devin Adams, DOB Apr 02, 1950, MRN [id number]  PCP:  Devin Maple, MD   Chief Complaint  Patient presents with  . Other    1 year follow up. Patient denies chest pain and SOB at this time. Meds reviewed verbally with patient.     HPI:  Devin Adams is a  67 -year-old gentleman  with a history of  hyperlipidemia,   Sleep apnea, uses nasal pillow low testosterone, shortness of breath with heavy exertion with previous stress test in 2008  who presents for routine followup of his hyperlipidemia and SOB  In follow-up, reports that he feels well with no complaints Wonders if he should have a stress test to make sure everything is okay Denies any significant symptoms of shortness of breath or chest pain  Active at baseline, working in his garden Weight is elevated but stable  CT abdomen pelvis from November 2016 reviewed with him Images pulled up in the office showing no significant aortic atherosclerosis  Total cholesterol 160 LDL 72 Numbers reviewed with him Normal glucose numbers, nondiabetic Nonsmoker  EKG personally reviewed by myself on todays visit normal sinus rhythm with rate 65 bpm, no significant ST or T-wave changes  Other past medical history reviewed CT scan images reviewed with him that he had for kidney stone This shows no significant PAD extending from descending aorta thoracic region down to his femoral arteries. Distal RCA with no coronary calcification  Notes indicate that his stress test 2008 was evaluated by Devin Adams and Devin Adams at [facility] and it was not felt that he needed further workup such as a cardiac catheterization. The inferior wall was initially read as showing some reversible changes the evaluation of static images by the above doctors did not suggest ischemia. No cardiac catheterization was performed and he has done well since then.  Mom had pacer, dad with pacer   PMH:   has a  past medical history of Enlarged prostate, History of kidney stones, Hyperlipidemia, Insomnia, unspecified, Kidney stone, Other testicular hypofunction, and Sleep apnea.  PSH:    Past Surgical History:  Procedure Laterality Date  . APPENDECTOMY    . COLONOSCOPY WITH PROPOFOL N/A 06/16/2016   Procedure: COLONOSCOPY WITH PROPOFOL;  Surgeon: Devin Lame, MD;  Location: [facility];  Service: Endoscopy;  Laterality: N/A;  sleep apnea  . ESOPHAGEAL DILATION    . LASER OF PROSTATE W/ GREEN LIGHT PVP    . POLYPECTOMY  06/16/2016   Procedure: POLYPECTOMY;  Surgeon: Devin Lame, MD;  Location: [facility];  Service: Endoscopy;;  . THYROID CYST EXCISION      Current Outpatient Medications  Medication Sig Dispense Refill  . gabapentin (NEURONTIN) 300 MG capsule Take 3 capsules (900 mg total) by mouth at bedtime. 180 capsule 4  . lovastatin (MEVACOR) 40 MG tablet Take 1 tablet (40 mg total) by mouth at bedtime. 90 tablet 4  . Multiple Vitamin (MULTIVITAMIN) tablet Take 1 tablet by mouth daily.    . Omega-3 Fatty Acids (FISH OIL PO) Take by mouth daily.    . tamsulosin (FLOMAX) 0.4 MG CAPS capsule Take 1 capsule (0.4 mg total) by mouth daily. 90 capsule 4  . venlafaxine XR (EFFEXOR-XR) 75 MG 24 hr capsule Take 1 capsule (75 mg total) by mouth daily with breakfast. 90 capsule 4   No current facility-administered medications for this visit.      Allergies:   Patient has no  known allergies.   Social History:  The patient  reports that he has never smoked. He has never used smokeless tobacco. He reports that he drinks about 3.0 standard drinks of alcohol per week. He reports that he does not use drugs.   Family History:   family history includes Brain cancer in his paternal uncle and paternal uncle; Heart failure in his father, mother, and unknown relative.    Review of Systems: Review of Systems  Constitutional: Negative.   Respiratory: Negative.   Cardiovascular: Negative.    Gastrointestinal: Negative.   Musculoskeletal: Negative.   Neurological: Negative.   Psychiatric/Behavioral: Negative.   All other systems reviewed and are negative.    PHYSICAL EXAM: VS:  BP 120/80 (BP Location: Left Arm, Patient Position: Sitting, Cuff Size: Normal)   Pulse 65   Ht 5\' 10"  (1.778 m)   Wt 230 lb (104.3 kg)   BMI 33.00 kg/m  , BMI Body mass index is 33 kg/m. Constitutional:  oriented to person, place, and time. No distress.  HENT:  Head: Normocephalic and atraumatic.  Eyes:  no discharge. No scleral icterus.  Neck: Normal range of motion. Neck supple. No JVD present.  Cardiovascular: Normal rate, regular rhythm, normal heart sounds and intact distal pulses. Exam reveals no gallop and no friction rub. No edema No murmur heard. Pulmonary/Chest: Effort normal and breath sounds normal. No stridor. No respiratory distress.  no wheezes.  no rales.  no tenderness.  Abdominal: Soft.  no distension.  no tenderness.  Musculoskeletal: Normal range of motion.  no  tenderness or deformity.  Neurological:  normal muscle tone. Coordination normal. No atrophy Skin: Skin is warm and dry. No rash noted. not diaphoretic.  Psychiatric:  normal mood and affect. behavior is normal. Thought content normal.    Recent Labs: 07/08/2017: Hemoglobin 14.3; Magnesium 2.1; Platelets 292; TSH 1.800 10/26/2017: ALT 29; BUN 16; Creatinine, Ser 1.06; NT-Pro BNP 109; Potassium 5.0; Sodium 143    Lipid Panel Lab Results  Component Value Date   CHOL 162 07/08/2017   HDL 43 07/08/2017   LDLCALC 70 07/08/2017   TRIG 245 (H) 07/08/2017      Wt Readings from Last 3 Encounters:  11/18/17 230 lb (104.3 kg)  10/26/17 226 lb (102.5 kg)  10/26/17 226 lb (102.5 kg)       ASSESSMENT AND PLAN:  SOB (shortness of breath) - Plan: EKG 12-Lead Stable symptoms Likely conditioning and weight, no further workup needed Recommended weight loss and exercise program  Mixed hyperlipidemia - Plan: EKG  12-Lead Recommended he stay on his cholesterol medication, preventative therapy Cholesterol numbers reviewed with him  Sleep apnea, unspecified type - Plan: EKG 12-Lead  compliant with his nasal pillow Recommended weight loss  Preventive care Long discussion concerning risk factors, for weight loss No further testing needed He has expressed interest in CT calcium scoring This will be ordered at his request  Disposition:   F/U  As needed   Total encounter time more than 45 minutes  Greater than 50% was spent in counseling and coordination of care with the patient    Orders Placed This Encounter  Procedures  . CT CARDIAC SCORING  . EKG 12-Lead     Signed, Esmond Plants, M.D., Ph.D. 11/18/2017  [place], [place]

## 2017-11-18 ENCOUNTER — Ambulatory Visit (INDEPENDENT_AMBULATORY_CARE_PROVIDER_SITE_OTHER): Payer: 59 | Admitting: Cardiovascular Disease

## 2017-11-18 ENCOUNTER — Encounter: Payer: Self-pay | Admitting: Cardiovascular Disease

## 2017-11-18 VITALS — BP 120/80 | HR 65 | Ht 70.0 in | Wt 230.0 lb

## 2017-11-18 DIAGNOSIS — E6609 Other obesity due to excess calories: Secondary | ICD-10-CM

## 2017-11-18 DIAGNOSIS — R0602 Shortness of breath: Secondary | ICD-10-CM | POA: Diagnosis not present

## 2017-11-18 DIAGNOSIS — Z8249 Family history of ischemic heart disease and other diseases of the circulatory system: Secondary | ICD-10-CM | POA: Diagnosis not present

## 2017-11-18 DIAGNOSIS — E782 Mixed hyperlipidemia: Secondary | ICD-10-CM | POA: Diagnosis not present

## 2017-11-18 DIAGNOSIS — E66811 Obesity, class 1: Secondary | ICD-10-CM

## 2017-11-18 DIAGNOSIS — G4730 Sleep apnea, unspecified: Secondary | ICD-10-CM | POA: Diagnosis not present

## 2017-11-18 DIAGNOSIS — R7301 Impaired fasting glucose: Secondary | ICD-10-CM

## 2017-11-18 DIAGNOSIS — Z6834 Body mass index (BMI) 34.0-34.9, adult: Secondary | ICD-10-CM

## 2017-11-18 DIAGNOSIS — Z0000 Encounter for general adult medical examination without abnormal findings: Secondary | ICD-10-CM

## 2017-11-18 NOTE — Patient Instructions (Addendum)
Medication Instructions:   Stop the aspirin  Labwork:  No new labs needed  Testing/Procedures:  We will order a CT coronary calcium score $150 (out of pocket)  Please call our [facility] office at [phone] to schedule  [facility] [address] [place], [place]   Follow-Up: It was a pleasure seeing you in the office today. Please call us if you have new issues that need to be addressed before your next appt.  [phone]  Your physician wants you to follow-up in:  As needed  If you need a refill on your cardiac medications before your next appointment, please call your pharmacy.  For educational health videos Log in to : [email or web address] Or : [email or web address], password : [email or web address]

## 2017-12-01 DIAGNOSIS — S233XXA Sprain of ligaments of thoracic spine, initial encounter: Secondary | ICD-10-CM | POA: Diagnosis not present

## 2017-12-01 DIAGNOSIS — J209 Acute bronchitis, unspecified: Secondary | ICD-10-CM | POA: Diagnosis not present

## 2017-12-02 ENCOUNTER — Telehealth: Payer: Self-pay | Admitting: Family Medicine

## 2017-12-02 NOTE — Telephone Encounter (Signed)
Copied from [place] [id number]. Topic: Quick Communication - See Telephone Encounter >> Dec 02, 2017  4:27 PM Devin Adams, Devin Adams wrote: CRM for notification. See Telephone encounter for: 12/02/17. Pt requesting a call from Dr. [name]. He is in [place] and was seen at a clinic yesterday and had x-rays done of his chest due to middle back pain when he coughs. They asked him if he ever had the pneumonia vaccine, which pt did. He states he may go back to that clinic tomorrow since the pain is still there. He would like to discuss with Dr. [name].

## 2017-12-03 NOTE — Telephone Encounter (Signed)
Please advise 

## 2017-12-03 NOTE — Telephone Encounter (Signed)
Phone call Patient recovering from bronchitis after treatment at walk-in in [place] will observe and follow-up if needed

## 2017-12-14 ENCOUNTER — Other Ambulatory Visit: Payer: Self-pay | Admitting: Unknown Physician Specialty

## 2017-12-14 DIAGNOSIS — G4730 Sleep apnea, unspecified: Secondary | ICD-10-CM

## 2017-12-14 NOTE — Telephone Encounter (Signed)
Switching to mail order pharmacy from local pharmacy.  Gabapentin refill Last refill:07/08/17 #180/4refill Last OV:07/08/17 HTV:[id number] Pharmacy: [place], [place] [phone] (Phone) [phone] (Fax)

## 2018-01-07 ENCOUNTER — Telehealth: Payer: Self-pay | Admitting: Cardiovascular Disease

## 2018-01-07 NOTE — Telephone Encounter (Signed)
Received records request [employer], forwarded to [facility] for processing.

## 2018-01-19 DIAGNOSIS — L538 Other specified erythematous conditions: Secondary | ICD-10-CM | POA: Diagnosis not present

## 2018-01-19 DIAGNOSIS — L298 Other pruritus: Secondary | ICD-10-CM | POA: Diagnosis not present

## 2018-01-19 DIAGNOSIS — L820 Inflamed seborrheic keratosis: Secondary | ICD-10-CM | POA: Diagnosis not present

## 2018-01-22 ENCOUNTER — Ambulatory Visit (INDEPENDENT_AMBULATORY_CARE_PROVIDER_SITE_OTHER): Payer: 59 | Admitting: Family Medicine

## 2018-01-22 ENCOUNTER — Encounter: Payer: Self-pay | Admitting: Family Medicine

## 2018-01-22 ENCOUNTER — Encounter

## 2018-01-22 DIAGNOSIS — F3342 Major depressive disorder, recurrent, in full remission: Secondary | ICD-10-CM

## 2018-01-22 DIAGNOSIS — E782 Mixed hyperlipidemia: Secondary | ICD-10-CM

## 2018-01-22 DIAGNOSIS — G4762 Sleep related leg cramps: Secondary | ICD-10-CM

## 2018-01-22 DIAGNOSIS — R350 Frequency of micturition: Secondary | ICD-10-CM

## 2018-01-22 DIAGNOSIS — G4730 Sleep apnea, unspecified: Secondary | ICD-10-CM | POA: Diagnosis not present

## 2018-01-22 DIAGNOSIS — E6609 Other obesity due to excess calories: Secondary | ICD-10-CM | POA: Diagnosis not present

## 2018-01-22 DIAGNOSIS — N401 Enlarged prostate with lower urinary tract symptoms: Secondary | ICD-10-CM

## 2018-01-22 DIAGNOSIS — Z6833 Body mass index (BMI) 33.0-33.9, adult: Secondary | ICD-10-CM

## 2018-01-22 DIAGNOSIS — R7301 Impaired fasting glucose: Secondary | ICD-10-CM

## 2018-01-22 MED ORDER — LOVASTATIN 40 MG PO TABS: 40.0000 mg | ORAL_TABLET | Freq: Every day | ORAL | 3 refills | Status: DC

## 2018-01-22 MED ORDER — VENLAFAXINE HCL ER 37.5 MG PO CP24: 37.5000 mg | ORAL_CAPSULE | Freq: Every day | ORAL | 1 refills | Status: DC

## 2018-01-22 MED ORDER — GABAPENTIN 100 MG PO CAPS: 100.0000 mg | ORAL_CAPSULE | Freq: Every day | ORAL | 3 refills | Status: DC

## 2018-01-22 NOTE — Progress Notes (Signed)
BP 128/76   Pulse 81   Temp 97.7 F (36.5 C) (Oral)   Ht 5\' 10"  (1.778 m)   Wt 231 lb (104.8 kg)   SpO2 98%   BMI 33.15 kg/m    Subjective:    Patient ID: Devin Adams, male    DOB: 1950-04-16, 67 y.o.   MRN: [id number]  HPI: Devin Adams is a 67 y.o. male  Chief Complaint  Patient presents with  . New Patient (Initial Visit)    HPI Patient is here to have establish care, transferring from another local practice Had a physical in March or April  He has high cholesterol; going on for 7-8 years; happy with the lovastatin; runs in the family; no muscle aches  Taking gabapentin; about 12 years ago, had a sleep study done; has OSA; has the machine and had trouble sleeping; doctor in [place] gave him gabapentin to help him sleep with the machine; when he takes the gabapentin, he can sleep 98% of the time all night; he will wake up 3-4 x a night; question is why does he need it in the first place; no trouble sleeping prior to the machine; always slept well; he is not able to sleep because of the machine; medicine really made a difference; newer machine  Weight has gone up in the last few months; eating well; was on a trip two months RV'ing around the country; normal around 220 pounds, then went on a cruise in the summer; he thinks it is what he has been eating; lowest adult weight in the last 10 years ago was 210 pounds  Saw Dr. Rockey Situ not long ago; last note reviewed; he has requested the calcium scoring test and has that coming up in [place] in November; had a kidney stone a few years ago and the scan did not show any calcium  On effexor, has been on that for 3-4 years; takes because of his wife; he's hard to live with when he's not on it; he gets moody; stress with moving then; sold one house and bought a new house; sounds to be chronic and recurrent; controlled witih medicine  On flomax; has not seen urologist for about 3 years; Dr. Jeananne Rama has managed, PSA in  April 2019, reviewed  In 2012, he woke up one morning, then had a swelling on the left side neck; resected and not cancer  He'll sit in a church and they notice a little tremor; head; one uncle may have had parkinsons; father mostly just had the shaking; handwriting has not gotten smaller; a little trouble remembering names, some memory issues; open invitation to see neurologist if worsening or changes his mind  He is getting more cramps in his legs and arms; through the years, he cramps when he overworks, quail hunting, doing a lot of lifting of legs, gets cramps at night; on a  Saturday, he used to work all day; right worse than left; thigh and the calf; has never sweated I his whole life in the back of the head and is now doing that; definitely like a cramp, rubbing it will help; walking it out will help; pickle juice   Depression screen [facility] 2/9 01/22/2018 10/26/2017 07/08/2017 05/28/2016 05/28/2015  Decreased Interest 0 0 0 1 0  Down, Depressed, Hopeless 0 0 0 1 0  PHQ - 2 Score 0 0 0 2 0  Altered sleeping 0 - - - -  Tired, decreased energy 0 - - - -  Change in appetite 0 - - - -  Feeling bad or failure about yourself  0 - - - -  Trouble concentrating 0 - - - -  Moving slowly or fidgety/restless 0 - - - -  Suicidal thoughts 0 - - - -  PHQ-9 Score 0 - - - -  Difficult doing work/chores Not difficult at all - - - -   Fall Risk  01/22/2018 10/26/2017 10/26/2017 07/08/2017 05/28/2016  Falls in the past year? No No No No No    Relevant past medical, surgical, family and social history reviewed Past Medical History:  Diagnosis Date  . Enlarged prostate   . History of kidney stones   . Hyperlipidemia   . Insomnia, unspecified   . Other testicular hypofunction   . Sleep apnea    CPAP   Past Surgical History:  Procedure Laterality Date  . APPENDECTOMY    . COLONOSCOPY WITH PROPOFOL N/A 06/16/2016   Procedure: COLONOSCOPY WITH PROPOFOL;  Surgeon: Lucilla Lame, MD;  Location: [place];  Service: Endoscopy;  Laterality: N/A;  sleep apnea  . ESOPHAGEAL DILATION    . LASER OF PROSTATE W/ GREEN LIGHT PVP    . POLYPECTOMY  06/16/2016   Procedure: POLYPECTOMY;  Surgeon: Lucilla Lame, MD;  Location: [place];  Service: Endoscopy;;  . THYROID CYST EXCISION     Family History  Problem Relation Age of Onset  . Heart failure Mother   . Heart failure Father   . Hypertension Father   . Heart failure Unknown        CHF-family hx on mothers side  . Brain cancer Paternal Uncle   . Brain cancer Paternal Uncle   . Heart failure Maternal Grandfather   . Crohn's disease Son    Social History   Tobacco Use  . Smoking status: Never Smoker  . Smokeless tobacco: Never Used  Substance Use Topics  . Alcohol use: Yes    Alcohol/week: 3.0 standard drinks    Types: 3 Cans of beer per week    Comment:    . Drug use: No     [facility] Visit from [facility]/22/2018 in [facility]  AUDIT-C Score  3      Interim medical history since last visit reviewed. Allergies and medications reviewed  Review of Systems Per HPI unless specifically indicated above     Objective:    BP 128/76   Pulse 81   Temp 97.7 F (36.5 C) (Oral)   Ht 5\' 10"  (1.778 m)   Wt 231 lb (104.8 kg)   SpO2 98%   BMI 33.15 kg/m   Wt Readings from Last 3 Encounters:  01/22/18 231 lb (104.8 kg)  11/18/17 230 lb (104.3 kg)  10/26/17 226 lb (102.5 kg)    Physical Exam  Constitutional: He appears well-developed and well-nourished. No distress.  HENT:  Head: Normocephalic and atraumatic.  Eyes: EOM are normal. No scleral icterus.  Neck: No thyromegaly present.  Cardiovascular: Normal rate and regular rhythm.  Pulses:      Dorsalis pedis pulses are 2+ on the right side, and 2+ on the left side.  Feet warm and well-perfused  Pulmonary/Chest: Effort normal and breath sounds normal.  Abdominal: Soft. Bowel sounds are normal. He exhibits no distension.  Musculoskeletal: He exhibits no  edema.  Neurological: He displays tremor (fine tremor with arms in extension). Coordination normal.  No cogwheeling  Skin: Skin is warm and dry. No pallor.  Psychiatric: He has  a normal mood and affect. His behavior is normal. Judgment and thought content normal.    Results for orders placed or performed in visit on 11/16/17  Gamma GT  Result Value Ref Range   GGT 48 0 - 65 IU/L      Assessment & Plan:   Problem List Items Addressed This Visit      Respiratory   Sleep apnea (Chronic)    Decrease the gabapentin to 200 mg at night; he has a newer machine; offered to send to sleep specialist        Genitourinary   BPH (benign prostatic hyperplasia)    On medicine; last PSA reviewed        Other   Obesity (Chronic)    Encouraged weight loss      Nocturnal leg cramps    Will have him try magnesium; continuing the gabapentin but at lower dose, so will see if any worsening with lower dose      Hyperlipidemia (Chronic)    Continue statin; try to limit saturated fats; monitor lipids every 6-12 months      Relevant Medications   lovastatin (MEVACOR) 40 MG tablet   Elevated fasting glucose    Last glucose was 94; will follow periodically; he'll try to lose weight      Depression (Chronic)    Patient wishes to continue the effexor      Relevant Medications   venlafaxine XR (EFFEXOR XR) 37.5 MG 24 hr capsule       Follow up plan: Return in about 6 months (around 07/10/2018) for Medicare Wellness check with complete physical, 40 minutes.  An after-visit summary was printed and given to the patient at [facility].  Please see the patient instructions which may contain other information and recommendations beyond what is mentioned above in the assessment and plan.  Meds ordered this encounter  Medications  . lovastatin (MEVACOR) 40 MG tablet    Sig: Take 1 tablet (40 mg total) by mouth at bedtime.    Dispense:  90 tablet    Refill:  3  . gabapentin (NEURONTIN) 100 MG  capsule    Sig: Take 1-2 capsules (100-200 mg total) by mouth at bedtime.    Dispense:  180 capsule    Refill:  3  . venlafaxine XR (EFFEXOR XR) 37.5 MG 24 hr capsule    Sig: Take 1 capsule (37.5 mg total) by mouth daily with breakfast.    Dispense:  90 capsule    Refill:  1    New lower dose    No orders of the defined types were placed in this encounter.

## 2018-01-22 NOTE — Patient Instructions (Addendum)
Check out the information at familydoctor.org entitled "Nutrition for Weight Loss: What You Need to Know about Fad Diets" Try to lose between 1-2 pounds per week by taking in fewer calories and burning off more calories You can succeed by limiting portions, limiting foods dense in calories and fat, becoming more active, and drinking 8 glasses of water a day (64 ounces) Don't skip meals, especially breakfast, as skipping meals may alter your metabolism Do not use over-the-counter weight loss pills or gimmicks that claim rapid weight loss A healthy BMI (or body mass index) is between 18.5 and 24.9 You can calculate your ideal BMI at the [place] website ClubMonetize.fr  Try to limit saturated fats in your diet (bologna, hot dogs, barbeque, cheeseburgers, hamburgers, steak, bacon, sausage, cheese, etc.) and get more fresh fruits, vegetables, and whole grains  Try magnesium oxide over-the-counter 250 daily to help with cramps  Taper the gabapentin from 300 mg to 200 mg at night; okay to decrease further to 100 mg if desired and helpful Decrease the effexor from 75 mg to 37.5 mg daily Call with any issues    Cholesterol Cholesterol is a fat. Your body needs a small amount of cholesterol. Cholesterol (plaque) may build up in your blood vessels (arteries). That makes you more likely to have a heart attack or stroke. You cannot feel your cholesterol level. Having a blood test is the only way to find out if your level is high. Keep your test results. Work with your doctor to keep your cholesterol at a good level. What do the results mean?  Total cholesterol is how much cholesterol is in your blood.  LDL is bad cholesterol. This is the type that can build up. Try to have low LDL.  HDL is good cholesterol. It cleans your blood vessels and carries LDL away. Try to have high HDL.  Triglycerides are fat that the body can store or burn for energy. What  are good levels of cholesterol?  Total cholesterol below 200.  LDL below 100 is good for people who have health risks. LDL below 70 is good for people who have very high risks.  HDL above 40 is good. It is best to have HDL of 60 or higher.  Triglycerides below 150. How can I lower my cholesterol? Diet Follow your diet program as told by your doctor.  Choose fish, white meat chicken, or [place] that is roasted or baked. Try not to eat red meat, fried foods, sausage, or lunch meats.  Eat lots of fresh fruits and vegetables.  Choose whole grains, beans, pasta, potatoes, and cereals.  Choose olive oil, corn oil, or canola oil. Only use small amounts.  Try not to eat butter, mayonnaise, shortening, or palm kernel oils.  Try not to eat foods with trans fats.  Choose low-fat or nonfat dairy foods. ? Drink skim or nonfat milk. ? Eat low-fat or nonfat yogurt and cheeses. ? Try not to drink whole milk or cream. ? Try not to eat ice cream, egg yolks, or full-fat cheeses.  Healthy desserts include angel food cake, ginger snaps, animal crackers, hard candy, popsicles, and low-fat or nonfat frozen yogurt. Try not to eat pastries, cakes, pies, and cookies.  Exercise Follow your exercise program as told by your doctor.  Be more active. Try gardening, walking, and taking the stairs.  Ask your doctor about ways that you can be more active.  Medicine  Take over-the-counter and prescription medicines only as told by your doctor. This information  is not intended to replace advice given to you by your health care provider. Make sure you discuss any questions you have with your health care provider. Document Released: 06/13/2008 Document Revised: 10/17/2015 Document Reviewed: 09/27/2015 Elsevier Interactive Patient Education  2018 [employer].  Preventing Unhealthy [employer], Adult Staying at a healthy weight is important. When fat builds up in your body, you may become overweight or  obese. These conditions put you at greater risk for developing certain health problems, such as heart disease, diabetes, sleeping problems, joint problems, and some cancers. Unhealthy weight gain is often the result of making unhealthy choices in what you eat. It is also a result of not getting enough exercise. You can make changes to your lifestyle to prevent obesity and stay as healthy as possible. What nutrition changes can be made? To maintain a healthy weight and prevent obesity:  Eat only as much as your body needs. To do this: ? Pay attention to signs that you are hungry or full. Stop eating as soon as you feel full. ? If you feel hungry, try drinking water first. Drink enough water so your urine is clear or pale yellow. ? Eat smaller portions. ? Look at serving sizes on food labels. Most foods contain more than one serving per container. ? Eat the recommended amount of calories for your gender and activity level. While most active people should eat around 2,000 calories per day, if you are trying to lose weight or are not very active, you main need to eat less calories. Talk to your health care provider or dietitian about how many calories you should eat each day.  Choose healthy foods, such as: ? Fruits and vegetables. Try to fill at least half of your plate at each meal with fruits and vegetables. ? Whole grains, such as whole wheat bread, brown rice, and quinoa. ? Lean meats, such as chicken or fish. ? Other healthy proteins, such as beans, eggs, or tofu. ? Healthy fats, such as nuts, seeds, fatty fish, and olive oil. ? Low-fat or fat-free dairy.  Check food labels and avoid food and drinks that: ? Are high in calories. ? Have added sugar. ? Are high in sodium. ? Have saturated fats or trans fats.  Limit how much you eat of the following foods: ? Prepackaged meals. ? Fast food. ? Fried foods. ? Processed meat, such as bacon, sausage, and deli meats. ? Fatty cuts of red meat  and poultry with skin.  Cook foods in healthier ways, such as by baking, broiling, or grilling.  When grocery shopping, try to shop around the outside of the store. This helps you buy mostly fresh foods and avoid canned and prepackaged foods.  What lifestyle changes can be made?  Exercise at least 30 minutes 5 or more days each week. Exercising includes brisk walking, yard work, biking, running, swimming, and team sports like basketball and soccer. Ask your health care provider which exercises are safe for you.  Do not use any products that contain nicotine or tobacco, such as cigarettes and e-cigarettes. If you need help quitting, ask your health care provider.  Limit alcohol intake to no more than 1 drink a day for nonpregnant women and 2 drinks a day for men. One drink equals 12 oz of beer, 5 oz of wine, or 1 oz of hard liquor.  Try to get 7-9 hours of sleep each night. What other changes can be made?  Keep a food and activity journal to  keep track of: ? What you ate and how many calories you had. Remember to count sauces, dressings, and side dishes. ? Whether you were active, and what exercises you did. ? Your calorie, weight, and activity goals.  Check your weight regularly. Track any changes. If you notice you have gained weight, make changes to your diet or activity routine.  Avoid taking weight-loss medicines or supplements. Talk to your health care provider before starting any new medicine or supplement.  Talk to your health care provider before trying any new diet or exercise plan. Why are these changes important? Eating healthy, staying active, and having healthy habits not only help prevent obesity, they also:  Help you to manage stress and emotions.  Help you to connect with friends and family.  Improve your self-esteem.  Improve your sleep.  Prevent long-term health problems.  What can happen if changes are not made? Being obese or overweight can cause you to  develop joint or bone problems, which can make it hard for you to stay active or do activities you enjoy. Being obese or overweight also puts stress on your heart and lungs and can lead to health problems like diabetes, heart disease, and some cancers. Where to find more information: Talk with your health care provider or a dietitian about healthy eating and healthy lifestyle choices. You may also find other information through these resources:  U.S. Department of Agriculture MyPlate: FormerBoss.no  American Heart Association: www.heart.org  Centers for Disease Control and Prevention: http://www.wolf.info/  Summary  Staying at a healthy weight is important. It helps prevent certain diseases and health problems, such as heart disease, diabetes, joint problems, sleep disorders, and some cancers.  Being obese or overweight can cause you to develop joint or bone problems, which can make it hard for you to stay active or do activities you enjoy.  You can prevent unhealthy weight gain by eating a healthy diet, exercising regularly, not smoking, limiting alcohol, and getting enough sleep.  Talk with your health care provider or a dietitian for guidance about healthy eating and healthy lifestyle choices. This information is not intended to replace advice given to you by your health care provider. Make sure you discuss any questions you have with your health care provider. Document Released: 03/18/2016 Document Revised: 04/23/2016 Document Reviewed: 04/23/2016 Elsevier Interactive Patient Education  [employer].

## 2018-01-22 NOTE — Assessment & Plan Note (Signed)
Decrease the gabapentin to 200 mg at night; he has a newer machine; offered to send to sleep specialist

## 2018-01-26 DIAGNOSIS — H43813 Vitreous degeneration, bilateral: Secondary | ICD-10-CM | POA: Diagnosis not present

## 2018-01-27 DIAGNOSIS — Z23 Encounter for immunization: Secondary | ICD-10-CM | POA: Diagnosis not present

## 2018-01-31 NOTE — Assessment & Plan Note (Signed)
On medicine; last PSA reviewed

## 2018-01-31 NOTE — Assessment & Plan Note (Signed)
Last glucose was 94; will follow periodically; he'll try to lose weight

## 2018-01-31 NOTE — Assessment & Plan Note (Signed)
Patient wishes to continue the effexor

## 2018-01-31 NOTE — Assessment & Plan Note (Signed)
Continue statin; try to limit saturated fats; monitor lipids every 6-12 months

## 2018-01-31 NOTE — Assessment & Plan Note (Signed)
Encouraged weight loss 

## 2018-01-31 NOTE — Assessment & Plan Note (Signed)
Will have him try magnesium; continuing the gabapentin but at lower dose, so will see if any worsening with lower dose

## 2018-02-02 ENCOUNTER — Ambulatory Visit (INDEPENDENT_AMBULATORY_CARE_PROVIDER_SITE_OTHER)
Admission: RE | Admit: 2018-02-02 | Discharge: 2018-02-02 | Disposition: A | Discharge status: Home / Self Care | Payer: 59 | Source: Ambulatory Visit | Attending: Cardiovascular Disease | Admitting: Cardiovascular Disease

## 2018-02-02 DIAGNOSIS — Z8249 Family history of ischemic heart disease and other diseases of the circulatory system: Secondary | ICD-10-CM

## 2018-04-12 DIAGNOSIS — Z85828 Personal history of other malignant neoplasm of skin: Secondary | ICD-10-CM | POA: Diagnosis not present

## 2018-04-12 DIAGNOSIS — D2371 Other benign neoplasm of skin of right lower limb, including hip: Secondary | ICD-10-CM | POA: Diagnosis not present

## 2018-04-12 DIAGNOSIS — R238 Other skin changes: Secondary | ICD-10-CM | POA: Diagnosis not present

## 2018-04-12 DIAGNOSIS — Z09 Encounter for follow-up examination after completed treatment for conditions other than malignant neoplasm: Secondary | ICD-10-CM | POA: Diagnosis not present

## 2018-04-12 DIAGNOSIS — D0439 Carcinoma in situ of skin of other parts of face: Secondary | ICD-10-CM | POA: Diagnosis not present

## 2018-04-12 DIAGNOSIS — L570 Actinic keratosis: Secondary | ICD-10-CM | POA: Diagnosis not present

## 2018-04-12 DIAGNOSIS — D485 Neoplasm of uncertain behavior of skin: Secondary | ICD-10-CM | POA: Diagnosis not present

## 2018-04-12 DIAGNOSIS — B078 Other viral warts: Secondary | ICD-10-CM | POA: Diagnosis not present

## 2018-06-03 DIAGNOSIS — C44329 Squamous cell carcinoma of skin of other parts of face: Secondary | ICD-10-CM | POA: Diagnosis not present

## 2018-06-03 DIAGNOSIS — D0439 Carcinoma in situ of skin of other parts of face: Secondary | ICD-10-CM | POA: Diagnosis not present

## 2018-06-09 DIAGNOSIS — G4733 Obstructive sleep apnea (adult) (pediatric): Secondary | ICD-10-CM | POA: Diagnosis not present

## 2018-07-12 ENCOUNTER — Other Ambulatory Visit: Payer: Self-pay | Admitting: Family Medicine

## 2018-07-12 NOTE — Telephone Encounter (Signed)
rx refilled.

## 2018-07-12 NOTE — Telephone Encounter (Signed)
Please advise last filled by Dr. Jeananne Rama

## 2018-07-13 ENCOUNTER — Encounter: Payer: 59 | Admitting: Family Medicine

## 2018-07-26 ENCOUNTER — Encounter: Payer: 59 | Admitting: Family Medicine

## 2018-08-16 ENCOUNTER — Other Ambulatory Visit: Payer: Self-pay | Admitting: Nurse Practitioner

## 2018-08-16 ENCOUNTER — Telehealth: Payer: Self-pay | Admitting: Family Medicine

## 2018-08-16 DIAGNOSIS — J302 Other seasonal allergic rhinitis: Secondary | ICD-10-CM

## 2018-08-16 MED ORDER — LORATADINE 10 MG PO TABS: 10.0000 mg | ORAL_TABLET | Freq: Every day | ORAL | 0 refills | Status: DC

## 2018-08-16 NOTE — Telephone Encounter (Signed)
Copied from [place] [id number]. Topic: General - Other >> Aug 16, 2018  9:45 AM Oneta Rack wrote: Relation to pt: self  Call back number: [place]: [id number] [place], [place] [address] [phone] (Phone) [phone] (Fax  Reason for call:  Patient experiencing runny nose but more so itchy eyes irration due to allergies, patient states he goes thru every spring, patient requesting prescription today, please advise

## 2018-08-16 NOTE — Telephone Encounter (Signed)
[facility], can make him mildly drowsy- take in the evening. Make appointment if not giving relief.

## 2018-08-16 NOTE — Telephone Encounter (Signed)
Patient was informed and said thanks.

## 2018-08-25 DIAGNOSIS — J309 Allergic rhinitis, unspecified: Secondary | ICD-10-CM | POA: Diagnosis not present

## 2018-08-25 DIAGNOSIS — H1045 Other chronic allergic conjunctivitis: Secondary | ICD-10-CM | POA: Diagnosis not present

## 2018-08-25 DIAGNOSIS — L2084 Intrinsic (allergic) eczema: Secondary | ICD-10-CM | POA: Diagnosis not present

## 2018-09-07 ENCOUNTER — Other Ambulatory Visit: Payer: Self-pay | Admitting: Nurse Practitioner

## 2018-09-29 ENCOUNTER — Other Ambulatory Visit: Payer: Self-pay | Admitting: Family Medicine

## 2018-09-29 DIAGNOSIS — G4730 Sleep apnea, unspecified: Secondary | ICD-10-CM

## 2018-09-29 MED ORDER — GABAPENTIN 100 MG PO CAPS: 100.0000 mg | ORAL_CAPSULE | Freq: Every day | ORAL | 0 refills | Status: DC

## 2018-09-29 NOTE — Addendum Note (Signed)
Addended by: Hubbard Hartshorn on: 09/29/2018 01:24 PM   Modules accepted: Orders

## 2018-09-29 NOTE — Telephone Encounter (Signed)
Pt has appt for 7.21.2020 with Benjamine Mola for his cpe

## 2018-09-29 NOTE — Addendum Note (Signed)
Addended by: Docia Furl on: 09/29/2018 10:54 AM   Modules accepted: Orders

## 2018-09-29 NOTE — Telephone Encounter (Signed)
Please advise 

## 2018-10-05 ENCOUNTER — Other Ambulatory Visit: Payer: Self-pay | Admitting: Nurse Practitioner

## 2018-10-11 ENCOUNTER — Other Ambulatory Visit: Payer: Self-pay | Admitting: Nurse Practitioner

## 2018-10-13 ENCOUNTER — Encounter: Payer: 59 | Admitting: Family Medicine

## 2018-10-19 ENCOUNTER — Other Ambulatory Visit: Payer: Self-pay

## 2018-10-19 ENCOUNTER — Encounter: Payer: Self-pay | Admitting: Nurse Practitioner

## 2018-10-19 ENCOUNTER — Ambulatory Visit (INDEPENDENT_AMBULATORY_CARE_PROVIDER_SITE_OTHER): Payer: 59 | Admitting: Nurse Practitioner

## 2018-10-19 VITALS — BP 122/76 | HR 66 | Temp 97.3°F | Resp 14 | Ht 69.0 in | Wt 230.1 lb

## 2018-10-19 DIAGNOSIS — R2689 Other abnormalities of gait and mobility: Secondary | ICD-10-CM

## 2018-10-19 DIAGNOSIS — Z125 Encounter for screening for malignant neoplasm of prostate: Secondary | ICD-10-CM | POA: Diagnosis not present

## 2018-10-19 DIAGNOSIS — G4762 Sleep related leg cramps: Secondary | ICD-10-CM | POA: Diagnosis not present

## 2018-10-19 DIAGNOSIS — R413 Other amnesia: Secondary | ICD-10-CM

## 2018-10-19 DIAGNOSIS — Z0000 Encounter for general adult medical examination without abnormal findings: Secondary | ICD-10-CM | POA: Diagnosis not present

## 2018-10-19 DIAGNOSIS — M545 Low back pain: Secondary | ICD-10-CM

## 2018-10-19 DIAGNOSIS — G8929 Other chronic pain: Secondary | ICD-10-CM

## 2018-10-19 DIAGNOSIS — M2560 Stiffness of unspecified joint, not elsewhere classified: Secondary | ICD-10-CM

## 2018-10-19 DIAGNOSIS — R4689 Other symptoms and signs involving appearance and behavior: Secondary | ICD-10-CM

## 2018-10-19 NOTE — Patient Instructions (Addendum)
-   Please talk to your sister and daughter-in-law about physical therapy for balance and let us know if you would like referral.  - [email or web address].[email or web address] - to discuss therapist options. Talk to your wife to see if she has noticed changed with the effexor if not we can come off of this medication - Make sure you are doing calf and thigh stretches for 5-10 minutes before bedtime to help with nocturnal cramps, we will check your electrolytes today to see if that is contributing. - Work on modest weight loss to improve joint pains and stiffness and do morning stretches and range of motion exercises to help with stifness. Can take acetaminophen 350-650mg  every 8 hours for pain (no more thatn 3,000mg  in 24hours) - Continue monitoring memory, increase omega 3 and antioxidants in diet and do brain exercises. Follow-up with worsening or not improving.  Here are some ways to help your memory: ? Learn a new skill. ? Writer, at a school, or at your place of worship. ? Spend time with friends and family. ?Use memory tools such as big calendars, to-do lists, and notes to yourself. ? Put your wallet or purse, keys, and glasses in the same place each day. ? Get lots of rest. ?Exercise and eat well. ? Don't drink a lot of alcohol. ? Get help if you feel depressed for weeks at a time.   Foods to Boost Your Brain and Memory Fatty Fish. When people talk about brain foods, fatty fish is often at the top of the list. ... Coffee. If coffee is the highlight of your morning, you'll be glad to hear that it's good for you. ... Blueberries.  Turmeric.  Broccoli.  Pumpkin Seeds.  Dark Chocolate. Nuts. ______ General recommendations: 150 minutes of physical activity weekly, eat two servings of fish weekly, eat one serving of tree nuts ( cashews, pistachios, pecans, almonds.Marland Kitchen) every other day, eat 6 servings of fruit/vegetables daily and drink plenty of water and avoid sweet beverages. Recommend  at least 64 ounces of water daily.    Stay Safe in the [place] The majority of sun exposure occurs before age 31 and skin cancer can take 20 years or more to develop. Whether your sun bathing days are behind you or you still spend time pursuing the perfect tan, you should be concerned about skin cancer.  Remember, the sun's ultraviolet (UV) rays can reflect off water, sand, concrete and snow, and can reach below the water's surface. Certain types of UV light penetrate fog and clouds, so it's possible to get sunburn even on overcast days.  Avoid direct sunlight as much as possible during the peak sun hours, generally 10 a.m. to 3 p.m., or seek shade during this part of day. Wear broad-spectrum sunscreen - with an SPF of at least 30 - containing both UVA and UVB protection. Look for ingredients like [employer] (also known as avobenzone) or titanium dioxide on the label. Reapply sunscreen frequently, at least every two hours when outdoors, especially if you perspire or you've been swimming. Your best bet is to choose water-resistant products that are more likely to stay on your skin. Wear lip balm with an SPF 15 or higher. Wear a hat and other protective clothing while in the sun. Tightly woven fibers and darker clothing generally provide more protection. Also, look for products approved by the American Academy of Dermatology. Wear UV-protective sunglasses.

## 2018-10-19 NOTE — Progress Notes (Signed)
Name: Devin Adams   MRN: [id number]    DOB: 03-01-1951   Date:10/19/2018       Progress Note  Subjective  Chief Complaint  Chief Complaint  Patient presents with  . Annual Exam    HPI  Patient presents for annual CPE and the following additional complaints:  -Patient endorses chronic lower back pain- saw chiropractor and has noticed some relief. Additionally mentioned to chiropractor he was having some tingling in left arm and they adjusted his elbow and symptoms have resolved. Denies paresthesia or sciatic pain, no bowel/bladder incontinence.   -Patient notes muscle cramps mostly in left leg or foot but occasionally in right thigh at night time. States in the past when he exerted himself a lot and would get cramps but has not been very physical lately. States it is increasing in frequency maybe having it 3 times a week now. Seems to be triggered by some movements. Legs do not feel restless and it is not constant.   -States when he gets out of bed and aches all over and states after he walks and moves around for a few minutes and then it resolves. Has not tried anything for this, does not localize to one area but states it is all over.   -States he has been feeling more off balance for the past 4-5 years, nothing acute or worsening but wanted to mention it. No weakness, no injuries, no falls- able to catch himself. Does not think it is an "inner ear thing". No dizziness or lightheadedness.   -He also notes that his memory has been declining. His lon-term memory is intact but short-term memory seems to be effected specifically with recall. Example He cannot recall the name of a place he has gone but in 5 minutes he can remember it. No safety concerns- leaving stove or car on, forgetting where he is. No one else has noticed memory issues, he is not sure of how long this has been going on.    -Patient also notes that he gets aggitated really easy- states he feels like he was more easy  going earlier in his life but feels he gets more aggravated then he typically would. Small things make him very frustrated. Started to take effexor 2 years ago but has not noticed any changes, but states wife wants him to take it- unsure if she has noticed a change in his behavior on it. Has not been physical.    USPSTF grade A and B recommendations:  Diet:  Eats cereal or oatmeal for breakfast Lunch- chicken and veggies Supper- chicken pot pie, wings, steak Snacks on chips Drinks about water a day 5-6 glasses of water a day Alcohol- 1 drink 3 days a week, weekend may drink more.  Exercise:  Nothing routine, likes to camp though.    6CIT Screen 10/19/2018 10/26/2017  What Year? 0 points 0 points  What month? 0 points 0 points  What time? 0 points 0 points  Count back from 20 0 points 0 points  Months in reverse 0 points 0 points  Repeat phrase 0 points 0 points  Total Score 0 0     Depression: phq 9 is negative Depression screen [facility] 2/9 10/19/2018 01/22/2018 10/26/2017 07/08/2017 05/28/2016  Decreased Interest 0 0 0 0 1  Down, Depressed, Hopeless 0 0 0 0 1  PHQ - 2 Score 0 0 0 0 2  Altered sleeping 0 0 - - -  Tired, decreased energy 0 0 - - -  Change in appetite 0 0 - - -  Feeling bad or failure about yourself  0 0 - - -  Trouble concentrating 0 0 - - -  Moving slowly or fidgety/restless 0 0 - - -  Suicidal thoughts 0 0 - - -  PHQ-9 Score 0 0 - - -  Difficult doing work/chores Not difficult at all Not difficult at all - - -    Hypertension:  BP Readings from Last 3 Encounters:  10/19/18 122/76  01/22/18 128/76  11/18/17 120/80    Obesity: Wt Readings from Last 3 Encounters:  10/19/18 230 lb 1.6 oz (104.4 kg)  01/22/18 231 lb (104.8 kg)  11/18/17 230 lb (104.3 kg)   BMI Readings from Last 3 Encounters:  10/19/18 33.98 kg/m  01/22/18 33.15 kg/m  11/18/17 33.00 kg/m     Lipids:  Lab Results  Component Value Date   CHOL 162 07/08/2017   CHOL 167 05/28/2016    CHOL 192 05/28/2015   Lab Results  Component Value Date   HDL 43 07/08/2017   HDL 45 05/28/2016   HDL 53 05/28/2015   Lab Results  Component Value Date   LDLCALC 70 07/08/2017   LDLCALC 92 05/28/2016   LDLCALC 112 (H) 05/28/2015   Lab Results  Component Value Date   TRIG 245 (H) 07/08/2017   TRIG 151 (H) 05/28/2016   TRIG 135 05/28/2015   Lab Results  Component Value Date   CHOLHDL 3.8 07/08/2017   CHOLHDL 3.7 05/28/2016   CHOLHDL 3.6 05/28/2015   No results found for: LDLDIRECT Glucose:  Glucose  Date Value Ref Range Status  10/26/2017 94 65 - 99 mg/dL Final  07/08/2017 101 (H) 65 - 99 mg/dL Final  05/28/2016 99 65 - 99 mg/dL Final   Glucose, Bld  Date Value Ref Range Status  02/18/2015 126 (H) 65 - 99 mg/dL Final      Office Visit from 10/19/2018 in [facility]  AUDIT-C Score  3      Married STD testing and prevention (HIV/chl/gon/syphilis): denies  Hep C: denies   Skin cancer: discussed Colorectal cancer: due 2021 Prostate cancer: last PSA 0.9 on 06/2017 No results found for: PSA   Lung cancer:  Low Dose CT Chest recommended if Age 35-80 years, 30 pack-year currently smoking OR have quit w/in 15years. Patient does not qualify.   AAA:  The USPSTF recommends one-time screening with ultrasonography in men ages 35 to 58 years who have ever smoked. Does not qualify.   Advanced Care Planning: A voluntary discussion about advance care planning including the explanation and discussion of advance directives.  Discussed health care proxy and Living will, and the patient was able to identify a health care proxy as wife- Markevion Lattin.  Patient does have a living will at present time. If patient does have living will, I have requested they bring this to the clinic to be scanned in to their chart.  Patient Active Problem List   Diagnosis Date Noted  . Preventative health care 11/18/2017  . Nocturnal leg cramps 07/08/2017  . Depression  12/28/2014  . Sleep apnea 12/28/2014  . Elevated fasting glucose 05/19/2014  . Obesity 05/03/2013  . BPH (benign prostatic hyperplasia) 09/06/2012  . 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 Urinary hesitancy 09/06/2012  . Hyperlipidemia 03/13/2011  . Advanced care planning/counseling discussion 03/13/2011    Past Surgical History:  Procedure Laterality Date  . APPENDECTOMY    . COLONOSCOPY WITH PROPOFOL N/A 06/16/2016  Procedure: COLONOSCOPY WITH PROPOFOL;  Surgeon: Lucilla Lame, MD;  Location: [place];  Service: Endoscopy;  Laterality: N/A;  sleep apnea  . ESOPHAGEAL DILATION    . LASER OF PROSTATE W/ GREEN LIGHT PVP    . POLYPECTOMY  06/16/2016   Procedure: POLYPECTOMY;  Surgeon: Lucilla Lame, MD;  Location: [place];  Service: Endoscopy;;  . THYROID CYST EXCISION      Family History  Problem Relation Age of [place]  . Heart failure Mother   . Heart failure Father   . Hypertension Father   . Heart failure Unknown        CHF-family hx on mothers side  . Brain cancer Paternal Uncle   . Brain cancer Paternal Uncle   . Heart failure Maternal Grandfather   . Crohn's disease Son     Social History   Socioeconomic History  . Marital status: Married    Spouse name: Karna Christmas  . Number of children: 3  . Years of education: 2 years tech school   . Highest education level: Associate degree: occupational, Hotel manager, or vocational program  Occupational History  . Not on file  Social Needs  . Financial resource strain: Not hard at all  . Food insecurity    Worry: Never true    Inability: Never true  . Transportation needs    Medical: No    Non-medical: No  Tobacco Use  . Smoking status: Never Smoker  . Smokeless tobacco: Never Used  Substance and Sexual Activity  . Alcohol use: Yes    Alcohol/week: 3.0 standard drinks    Types: 3 Cans of beer per week    Comment:    . Drug use: No  . Sexual activity: Yes  Lifestyle  . Physical activity    Days  per week: 0 days    Minutes per session: 0 min  . Stress: Not at all  Relationships  . Social connections    Talks on phone: More than three times a week    Gets together: More than three times a week    Attends religious service: More than 4 times per year    Active member of club or organization: No    Attends meetings of clubs or organizations: Never    Relationship status: Married  . Intimate partner violence    Fear of current or ex partner: No    Emotionally abused: No    Physically abused: No    Forced sexual activity: No  Other Topics Concern  . Not on file  Social History Narrative   Working full time ; married; does not get regular exercise.     Current Outpatient Medications:  .  cholecalciferol (VITAMIN D) 1000 units tablet, Take 1,000 Units by mouth daily., Disp: , Rfl:  .  gabapentin (NEURONTIN) 100 MG capsule, Take 1-2 capsules (100-200 mg total) by mouth at bedtime., Disp: 30 capsule, Rfl: 0 .  loratadine (CLARITIN) 10 MG tablet, TAKE 1 TABLET BY MOUTH EVERY DAY (Patient taking differently: Take 10 mg by mouth daily as needed. ), Disp: 90 tablet, Rfl: 1 .  lovastatin (MEVACOR) 40 MG tablet, Take 1 tablet (40 mg total) by mouth at bedtime., Disp: 90 tablet, Rfl: 3 .  tamsulosin (FLOMAX) 0.4 MG CAPS capsule, TAKE 1 CAPSULE BY MOUTH EVERY DAY, Disp: 90 capsule, Rfl: 1 .  venlafaxine XR (EFFEXOR XR) 37.5 MG 24 hr capsule, Take 1 capsule (37.5 mg total) by mouth daily with breakfast., Disp: 90 capsule, Rfl: 1 .  vitamin B-12 (CYANOCOBALAMIN) 100 MCG tablet, Take 100 mcg by mouth daily., Disp: , Rfl:  .  Multiple Vitamin (MULTIVITAMIN) tablet, Take 1 tablet by mouth daily., Disp: , Rfl:  .  Omega-3 Fatty Acids (FISH OIL PO), Take by mouth daily., Disp: , Rfl:   No Known Allergies   Review of Systems  Constitutional: Negative for chills, fever and malaise/fatigue.  HENT: Negative for congestion, sinus pain and sore throat.   Eyes: Negative for blurred vision and  double vision.  Respiratory: Negative for cough and shortness of breath.   Cardiovascular: Negative for chest pain, palpitations and leg swelling.  Gastrointestinal: Negative for abdominal pain, blood in stool, constipation, diarrhea and nausea.  Genitourinary: Negative for dysuria and hematuria.  Musculoskeletal: Positive for falls and joint pain.  Skin: Negative for rash.  Neurological: Negative for dizziness, tingling, weakness and headaches.  Endo/Heme/Allergies: Negative for polydipsia.  Psychiatric/Behavioral: The patient is not nervous/anxious and does not have insomnia.      Objective  Vitals:   10/19/18 1031  BP: 122/76  Pulse: 66  Resp: 14  Temp: (!) 97.3 F (36.3 C)  TempSrc: Temporal  SpO2: 98%  Weight: 230 lb 1.6 oz (104.4 kg)  Height: 5\' 9"  (1.753 m)    Body mass index is 33.98 kg/m.  Physical Exam Constitutional: Patient appears well-developed and well-nourished. No distress.  HENT: Head: Normocephalic and atraumatic.  Eyes: Conjunctivae and EOM are normal. Pupils are equal, round, and reactive to light. No scleral icterus.  Neck: Normal range of motion. Neck supple. No JVD present. No thyromegaly present.  Cardiovascular: Normal rate, regular rhythm and normal heart sounds.  No murmur heard. No BLE edema. Pulmonary/Chest: Effort normal and breath sounds normal. No respiratory distress. Abdominal: Soft. Bowel sounds are normal, no distension. There is no tenderness. no masses Musculoskeletal: Normal range of motion, no joint effusions. No gross deformities Neurological: he is alert and oriented to person, place, and time. No cranial nerve deficit. Coordination, balance, strength, speech and gait are normal.  Skin: Skin is warm and dry. No rash noted. No erythema.  Psychiatric: Patient has a normal mood and affect. behavior is normal. Judgment and thought content normal.   No results found for this or any previous visit (from the past 2160  hour(s)).    Fall Risk: Fall Risk  10/19/2018 01/22/2018 10/26/2017 10/26/2017 07/08/2017  Falls in the past year? 0 No No No No  Number falls in past yr: 0 - - - -  Injury with Fall? 0 - - - -     Functional Status Survey: Is the patient deaf or have difficulty hearing?: No Does the patient have difficulty seeing, even when wearing glasses/contacts?: No Does the patient have difficulty concentrating, remembering, or making decisions?: No Does the patient have difficulty walking or climbing stairs?: No Does the patient have difficulty dressing or bathing?: No Does the patient have difficulty doing errands alone such as visiting a doctor's office or shopping?: No    Assessment & Plan  1. Preventative health care - COMPLETE METABOLIC PANEL WITH GFR - Lipid Profile - PSA  2. Screening for prostate cancer - PSA  3. Chronic midline low back pain without sciatica Continue with chiropractor, PRN acetaminophen   4. Nocturnal leg cramps Looking at electrolytes, discussed stretches before bedtime   5. 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 Memory impairment Discussed diet and brain health, monitor if not improving or  if worsening can refer for memory testing.   7. Balance disorder Declined PT-highly recommended for further evaluation and treatment and prevention of falls. States will reach out to sister who is PT and let us know.   8. Aggression aggravated Declined therapy- highly recommended for further evaluation and treatment, will ask wife if she notices changes with effexor and if not can come off medication and we can consider other options.     -Prostate cancer screening and PSA options (with potential risks and benefits of testing vs not testing) were discussed along with recent recs/guidelines. -USPSTF grade A and B recommendations reviewed with patient; age-appropriate  recommendations, preventive care, screening tests, etc discussed and encouraged; healthy living encouraged; see AVS for patient education given to patient -Discussed importance of 150 minutes of physical activity weekly, eat two servings of fish weekly, eat one serving of tree nuts ( cashews, pistachios, pecans, almonds.Marland Kitchen) every other day, eat 6 servings of fruit/vegetables daily and drink plenty of water and avoid sweet beverages.

## 2018-10-20 ENCOUNTER — Other Ambulatory Visit: Payer: Self-pay | Admitting: Nurse Practitioner

## 2018-10-20 LAB — LIPID PANEL
Cholesterol: 221 mg/dL — ABNORMAL HIGH (ref <200)
HDL: 50 mg/dL (ref >=40)
LDL Cholesterol (Calc): 138 mg/dL (calc) — ABNORMAL HIGH
Non-HDL Cholesterol (Calc): 171 mg/dL (calc) — ABNORMAL HIGH (ref <130)
Total CHOL/HDL Ratio: 4.4 (calc) (ref <5.0)
Triglycerides: 191 mg/dL — ABNORMAL HIGH (ref <150)

## 2018-10-20 LAB — COMPLETE METABOLIC PANEL WITH GFR
AG Ratio: 1.8 (calc) (ref 1.0–2.5)
ALT: 44 U/L (ref 9–46)
AST: 27 U/L (ref 10–35)
Albumin: 4.4 g/dL (ref 3.6–5.1)
Alkaline phosphatase (APISO): 70 U/L (ref 35–144)
BUN: 15 mg/dL (ref 7–25)
CO2: 26 mmol/L (ref 20–32)
Calcium: 9.6 mg/dL (ref 8.6–10.3)
Chloride: 107 mmol/L (ref 98–110)
Creat: 1.05 mg/dL (ref 0.70–1.25)
GFR, Est African American: 84 mL/min/{1.73_m2} (ref >=60)
GFR, Est Non African American: 73 mL/min/{1.73_m2} (ref >=60)
Globulin: 2.4 g/dL (calc) (ref 1.9–3.7)
Glucose, Bld: 95 mg/dL (ref 65–99)
Potassium: 4.6 mmol/L (ref 3.5–5.3)
Sodium: 140 mmol/L (ref 135–146)
Total Bilirubin: 0.4 mg/dL (ref 0.2–1.2)
Total Protein: 6.8 g/dL (ref 6.1–8.1)

## 2018-10-20 LAB — PSA: PSA: 0.7 ng/mL (ref <=4.0)

## 2018-10-20 MED ORDER — ATORVASTATIN CALCIUM 40 MG PO TABS: 40.0000 mg | ORAL_TABLET | Freq: Every day | ORAL | 0 refills | Status: DC

## 2018-11-03 ENCOUNTER — Telehealth: Payer: Self-pay | Admitting: Nurse Practitioner

## 2018-11-03 NOTE — Telephone Encounter (Signed)
The atorvastatin is written for 1 tablet once daily.  He received 90 tablets, which is a 90 day supply.  I can send in a refill, but Benjamine Mola just sent one in on 10/20/18.

## 2018-11-03 NOTE — Telephone Encounter (Signed)
Left pt detailed voicemail.

## 2018-11-03 NOTE — Telephone Encounter (Signed)
Medication Refill - Medication: atorvastatin (LIPITOR) 40 MG tablet Pt stated that he normally receives 90 day supply for medication but the pharmacy only gave him 90 tablets. He would like to know if another rx can be sent in for the 60 days. Pt will be going out of town and run out while he is gone. Rx sent in on 10/20/18 reads 90 tablet. Please advise   Has the patient contacted their pharmacy? Yes.   (Agent: If no, request that the patient contact the pharmacy for the refill.) (Agent: If yes, when and what did the pharmacy advise?)  Preferred Pharmacy (with phone number or street name):  [id number] [place], [address] [place] [phone] (Phone) [phone] (Fax)     Agent: Please be advised that RX refills may take up to 3 business days. We ask that you follow-up with your pharmacy.

## 2018-12-10 ENCOUNTER — Telehealth: Payer: Self-pay | Admitting: Internal Medicine

## 2018-12-10 NOTE — Telephone Encounter (Signed)
error 

## 2019-01-06 ENCOUNTER — Ambulatory Visit (INDEPENDENT_AMBULATORY_CARE_PROVIDER_SITE_OTHER): Payer: Medicare Other

## 2019-01-06 ENCOUNTER — Other Ambulatory Visit: Payer: Self-pay | Admitting: Podiatry

## 2019-01-06 ENCOUNTER — Ambulatory Visit (INDEPENDENT_AMBULATORY_CARE_PROVIDER_SITE_OTHER): Payer: Medicare Other | Admitting: Podiatry

## 2019-01-06 ENCOUNTER — Other Ambulatory Visit: Payer: Self-pay

## 2019-01-06 ENCOUNTER — Encounter: Payer: Self-pay | Admitting: Podiatry

## 2019-01-06 VITALS — BP 121/69

## 2019-01-06 DIAGNOSIS — M779 Enthesopathy, unspecified: Secondary | ICD-10-CM

## 2019-01-06 DIAGNOSIS — M7752 Other enthesopathy of left foot: Secondary | ICD-10-CM

## 2019-01-06 DIAGNOSIS — M79672 Pain in left foot: Secondary | ICD-10-CM

## 2019-01-06 DIAGNOSIS — M7751 Other enthesopathy of right foot: Secondary | ICD-10-CM

## 2019-01-06 NOTE — Progress Notes (Signed)
Subjective:   Patient ID: Devin Adams, male   DOB: 68 y.o.   MRN: [id number]   HPI Patient presents stating he gets generalized pain in his feet after periods of sitting or when getting up in the morning and has had orthotics in the past and has exquisite discomfort in the left lateral foot ankle but does not remember injury of short-term duration.  Patient does not smoke likes to be active   Review of Systems  All other systems reviewed and are negative.       Objective:  Physical Exam Vitals signs and nursing note reviewed.  Constitutional:      Appearance: He is well-developed.  Pulmonary:     Effort: Pulmonary effort is normal.  Musculoskeletal: Normal range of motion.  Skin:    General: Skin is warm.  Neurological:     Mental Status: He is alert.     Neurovascular status intact muscle strength found to be adequate range of motion within normal limits with exquisite discomfort in the sinus tarsi and plantar to this area with a lot of discomfort in the area.  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  I did sterile prep and injected the sinus tarsi left 3 mg Kenalog 5 mg Xylocaine advised on reduced activity and reappoint as symptoms indicate and will be coming in for orthotics to see Liliane Channel in the next 4 weeks  X-ray was negative for signs of fracture or any bone or other issue associated with his condition

## 2019-01-12 ENCOUNTER — Other Ambulatory Visit: Payer: Self-pay | Admitting: Emergency Medicine

## 2019-01-12 ENCOUNTER — Telehealth: Payer: Self-pay | Admitting: Family Medicine

## 2019-01-12 NOTE — Telephone Encounter (Signed)
Copied from [place] [id number]. Topic: Quick Communication - Rx Refill/Question >> Jan 12, 2019  9:37 AM Rainey Pines A wrote: Medication: atorvastatin (LIPITOR) 40 MG tablet (Pharmacy has tried reaching office multiple times to get medication sent over.)  Has the patient contacted their pharmacy? Yes (Agent: If no, request that the patient contact the pharmacy for the refill.) (Agent: If yes, when and what did the pharmacy advise?)Contact PCP  Preferred Pharmacy (with phone number or street name[id number] [place], [place] [address] [phone] (Phone) [phone] (Fax)    Agent: Please be advised that RX refills may take up to 3 business days. We ask that you follow-up with your pharmacy.

## 2019-01-12 NOTE — Telephone Encounter (Signed)
Order pend to [facility]

## 2019-01-13 ENCOUNTER — Other Ambulatory Visit: Payer: Self-pay

## 2019-01-13 ENCOUNTER — Other Ambulatory Visit: Payer: Self-pay | Admitting: Family Medicine

## 2019-01-20 ENCOUNTER — Other Ambulatory Visit: Payer: Self-pay | Admitting: Emergency Medicine

## 2019-01-20 MED ORDER — ATORVASTATIN CALCIUM 40 MG PO TABS: 40.0000 mg | ORAL_TABLET | Freq: Every day | ORAL | 0 refills | Status: DC

## 2019-01-20 NOTE — Telephone Encounter (Signed)
Patient is calling regarding his medication refill. [facility] [id number] [place], [place]. [phone].

## 2019-01-20 NOTE — Telephone Encounter (Signed)
Rx sent for #90

## 2019-01-20 NOTE — Telephone Encounter (Signed)
This has been sent to you and he need a refill asap

## 2019-02-02 ENCOUNTER — Ambulatory Visit: Payer: Medicare Other | Admitting: Orthotics

## 2019-02-02 ENCOUNTER — Other Ambulatory Visit: Payer: Self-pay

## 2019-02-02 ENCOUNTER — Ambulatory Visit: Payer: Self-pay | Admitting: Podiatry

## 2019-02-02 DIAGNOSIS — M79672 Pain in left foot: Secondary | ICD-10-CM

## 2019-02-02 DIAGNOSIS — M779 Enthesopathy, unspecified: Secondary | ICD-10-CM

## 2019-02-02 NOTE — Progress Notes (Signed)
Patient came in today to pick up custom made foot orthotics.  The goals were accomplished and the patient reported no dissatisfaction with said orthotics.  Patient was advised of breakin period and how to report any issues. 

## 2019-02-06 NOTE — Progress Notes (Signed)
Cardiology Office Note  Date:  02/07/2019   ID:  Devin Adams, DOB July 06, 1950, MRN [id number]  PCP:  Devin Aus, MD   Chief Complaint  Patient presents with  . other    12 month follow up. Meds reviewed by the pt. verbally. Pt. c/o shortness of breath with little exertion.     HPI:  Devin Adams is a  68 -year-old gentleman  with a history of  hyperlipidemia,   Sleep apnea, uses nasal pillow low testosterone, shortness of breath with heavy exertion with previous stress test in 2008  CT abdomen pelvis from November 2016  no significant aortic atherosclerosis who presents for routine followup of his hyperlipidemia and SOB  Doing well Sedentary,  Weight up, 5 pounds No exercise, stopped with covid Retired aug 2020 Does yard work times  CT coronary calcium score (score of zero)  PMD changed from lovastatin to lipitor 40 for total chol 220  minimal SOB getting out of bed to urinate Some cramping in legs  Total cholesterol was 160 LDL 72, now total chol 220 On lovastatin  Normal glucose numbers, nondiabetic Nonsmoker  EKG personally reviewed by myself on todays visit normal sinus rhythm with rate 63 bpm, no significant ST or T-wave changes  Other past medical history reviewed CT scan images reviewed with him that he had for kidney stone This shows no significant PAD extending from descending aorta thoracic region down to his femoral arteries. Distal RCA with no coronary calcification  Notes indicate that his stress test 2008 was evaluated by Dr. Verl Blalock and Dr. Percival Spanish at [place] and it was not felt that he needed further workup such as a cardiac catheterization. The inferior wall was initially read as showing some reversible changes the evaluation of static images by the above doctors did not suggest ischemia. No cardiac catheterization was performed and he has done well since then.  Mom had pacer, dad with pacer   PMH:   has a past medical history of Enlarged  prostate, History of kidney stones, Hyperlipidemia, Insomnia, unspecified, Other testicular hypofunction, and Sleep apnea.  PSH:    Past Surgical History:  Procedure Laterality Date  . APPENDECTOMY    . COLONOSCOPY WITH PROPOFOL N/A 06/16/2016   Procedure: COLONOSCOPY WITH PROPOFOL;  Surgeon: Lucilla Lame, MD;  Location: [facility];  Service: Endoscopy;  Laterality: N/A;  sleep apnea  . ESOPHAGEAL DILATION    . LASER OF PROSTATE W/ GREEN LIGHT PVP    . POLYPECTOMY  06/16/2016   Procedure: POLYPECTOMY;  Surgeon: Lucilla Lame, MD;  Location: [facility];  Service: Endoscopy;;  . THYROID CYST EXCISION      Current Outpatient Medications  Medication Sig Dispense Refill  . atorvastatin (LIPITOR) 40 MG tablet Take 1 tablet (40 mg total) by mouth daily. To replace lovastatin 90 tablet 0  . cholecalciferol (VITAMIN D) 1000 units tablet Take 1,000 Units by mouth daily.    Marland Kitchen gabapentin (NEURONTIN) 100 MG capsule Take 1-2 capsules (100-200 mg total) by mouth at bedtime. 30 capsule 0  . loratadine (CLARITIN) 10 MG tablet TAKE 1 TABLET BY MOUTH EVERY DAY (Patient taking differently: Take 10 mg by mouth daily as needed. ) 90 tablet 1  . Multiple Vitamin (MULTIVITAMIN) tablet Take 1 tablet by mouth daily.    . Omega-3 Fatty Acids (FISH OIL PO) Take by mouth daily.    . tamsulosin (FLOMAX) 0.4 MG CAPS capsule TAKE 1 CAPSULE BY MOUTH EVERY DAY 90 capsule 1  No current facility-administered medications for this visit.      Allergies:   Patient has no known allergies.   Social History:  The patient  reports that he has never smoked. He has never used smokeless tobacco. He reports current alcohol use of about 3.0 standard drinks of alcohol per week. He reports that he does not use drugs.   Family History:   family history includes Brain cancer in his paternal uncle and paternal uncle; Crohn's disease in his son; Heart failure in his father, maternal grandfather, mother, and another family  member; Hypertension in his father.    Review of Systems: Review of Systems  Constitutional: Negative.        Weight gain  HENT: Negative.   Respiratory: Negative.   Cardiovascular: Negative.   Gastrointestinal: Negative.   Musculoskeletal: Negative.   Neurological: Negative.   Psychiatric/Behavioral: Negative.   All other systems reviewed and are negative.    PHYSICAL EXAM: VS:  BP 140/80 (BP Location: Left Arm, Patient Position: Sitting, Cuff Size: Normal)   Pulse 63   Ht 5\' 10"  (1.778 m)   Wt 236 lb 4 oz (107.2 kg)   BMI 33.90 kg/m  , BMI Body mass index is 33.9 kg/m. Constitutional:  oriented to person, place, and time. No distress.  HENT:  Head: Grossly normal Eyes:  no discharge. No scleral icterus.  Neck: No JVD, no carotid bruits  Cardiovascular: Regular rate and rhythm, no murmurs appreciated Pulmonary/Chest: Clear to auscultation bilaterally, no wheezes or rails Abdominal: Soft.  no distension.  no tenderness.  Musculoskeletal: Normal range of motion Neurological:  normal muscle tone. Coordination normal. No atrophy Skin: Skin warm and dry Psychiatric: normal affect, pleasant   Recent Labs: 10/19/2018: ALT 44; BUN 15; Creat 1.05; Potassium 4.6; Sodium 140    Lipid Panel Lab Results  Component Value Date   CHOL 221 (H) 10/19/2018   HDL 50 10/19/2018   LDLCALC 138 (H) 10/19/2018   TRIG 191 (H) 10/19/2018      Wt Readings from Last 3 Encounters:  02/07/19 236 lb 4 oz (107.2 kg)  10/19/18 230 lb 1.6 oz (104.4 kg)  01/22/18 231 lb (104.8 kg)       ASSESSMENT AND PLAN:  SOB (shortness of breath) - Plan: EKG 12-Lead Recommended weight loss through dietary changes, regular walking program  Mixed hyperlipidemia - Plan: EKG 12-Lead Long discussion concerning his cholesterol management Having cramping possibly from the Lipitor 40 daily Did not have cramping on lovastatin 40 Suggested he go back to lovastatin 40 daily but might need to add  Zetia Alternatively could change to Crestor He will think about it, likely due lovastatin Repeat lab work with Dr. Sabra Heck  Sleep apnea, unspecified type - Plan: EKG 12-Lead Uses nasal pillow, We recommended dietary changes for weight loss  Preventive care CT coronary calcium score of 0   Disposition:   F/U  As needed   Total encounter time more than 25 minutes  Greater than 50% was spent in counseling and coordination of care with the patient    Orders Placed This Encounter  Procedures  . EKG 12-Lead     Signed, Esmond Plants, M.D., Ph.D. 02/07/2019  [place], [place]

## 2019-02-07 ENCOUNTER — Ambulatory Visit (INDEPENDENT_AMBULATORY_CARE_PROVIDER_SITE_OTHER): Payer: Medicare Other | Admitting: Cardiovascular Disease

## 2019-02-07 ENCOUNTER — Encounter: Payer: Self-pay | Admitting: Cardiovascular Disease

## 2019-02-07 ENCOUNTER — Other Ambulatory Visit: Payer: Self-pay

## 2019-02-07 ENCOUNTER — Ambulatory Visit: Payer: 59 | Admitting: Family Medicine

## 2019-02-07 VITALS — BP 140/80 | HR 63 | Ht 70.0 in | Wt 236.2 lb

## 2019-02-07 DIAGNOSIS — E782 Mixed hyperlipidemia: Secondary | ICD-10-CM | POA: Diagnosis not present

## 2019-02-07 DIAGNOSIS — G4730 Sleep apnea, unspecified: Secondary | ICD-10-CM | POA: Diagnosis not present

## 2019-02-07 DIAGNOSIS — R0602 Shortness of breath: Secondary | ICD-10-CM | POA: Diagnosis not present

## 2019-02-07 NOTE — Patient Instructions (Addendum)
If cramps get worse, Hold the lipitor for a few weeks Then call me  We could try crestor,  Or lower dose lipitor and add zetia  Or change back to lovastatin  Medication Instructions:  No changes  If you need a refill on your cardiac medications before your next appointment, please call your pharmacy.    Lab work: No new labs needed   If you have labs (blood work) drawn today and your tests are completely normal, you will receive your results only by: Marland Kitchen MyChart Message (if you have MyChart) OR . A paper copy in the mail If you have any lab test that is abnormal or we need to change your treatment, we will call you to review the results.   Testing/Procedures: No new testing needed   Follow-Up: At [facility], you and your health needs are our priority.  As part of our continuing mission to provide you with exceptional heart care, we have created designated Provider Care Teams.  These Care Teams include your primary Cardiologist (physician) and Advanced Practice Providers (APPs -  Physician Assistants and Nurse Practitioners) who all work together to provide you with the care you need, when you need it.  . You will need a follow up appointment in 12 months .   Please call our office 2 months in advance to schedule this appointment.    . Providers on your designated Care Team:   . Murray Hodgkins, NP . Christell Faith, PA-C . Marrianne Mood, PA-C  Any Other Special Instructions Will Be Listed Below (If Applicable).  For educational health videos Log in to : [email or web address] Or : [email or web address], password : [email or web address]

## 2019-03-04 DIAGNOSIS — R351 Nocturia: Secondary | ICD-10-CM | POA: Insufficient documentation

## 2019-03-04 DIAGNOSIS — G250 Essential tremor: Secondary | ICD-10-CM | POA: Insufficient documentation

## 2019-03-04 DIAGNOSIS — N401 Enlarged prostate with lower urinary tract symptoms: Secondary | ICD-10-CM | POA: Insufficient documentation

## 2019-04-15 ENCOUNTER — Other Ambulatory Visit: Payer: Self-pay | Admitting: Family Medicine

## 2019-04-15 NOTE — Telephone Encounter (Signed)
Requested medication (s) are due for refill today: yes  Requested medication (s) are on the active medication list: yes  Last refill:  01/20/2019  Future visit scheduled: no  Notes to clinic: review for refill Last filled by Raelyn Ensign Looks like a different provider is listed    Requested Prescriptions  Pending Prescriptions Disp Refills   atorvastatin (LIPITOR) 40 MG tablet [Pharmacy Med Name: ATORVASTATIN 40 MG TABLET] 90 tablet 0    Sig: Take 1 tablet (40 mg total) by mouth daily. To replace lovastatin      Cardiovascular:  Antilipid - Statins Failed - 04/15/2019 12:18 AM      Failed - Total Cholesterol in normal range and within 360 days    Cholesterol, Total  Date Value Ref Range Status  07/08/2017 162 100 - 199 mg/dL Final   Cholesterol  Date Value Ref Range Status  10/19/2018 221 (H) <200 mg/dL Final          Failed - LDL in normal range and within 360 days    LDL Cholesterol (Calc)  Date Value Ref Range Status  10/19/2018 138 (H) mg/dL (calc) Final    Comment:    Reference range: <100 . Desirable range <100 mg/dL for primary prevention;   <70 mg/dL for patients with CHD or diabetic patients  with > or = 2 CHD risk factors. Marland Kitchen LDL-C is now calculated using the Martin-Hopkins  calculation, which is a validated novel method providing  better accuracy than the Friedewald equation in the  estimation of LDL-C.  Cresenciano Genre et al. Annamaria Helling. [id number]): 2061-2068  (http://education.[employer].com/faq/FAQ164)           Failed - Triglycerides in normal range and within 360 days    Triglycerides  Date Value Ref Range Status  10/19/2018 191 (H) <150 mg/dL Final          Passed - HDL in normal range and within 360 days    HDL  Date Value Ref Range Status  10/19/2018 50 > OR = 40 mg/dL Final  07/08/2017 43 >39 mg/dL Final          Passed - Patient is not pregnant      Passed - Valid encounter within last 12 months    Recent Outpatient Visits       5 months ago Preventative health care   [place], NP   1 year ago Sleep apnea, unspecified type   [place], Satira Anis, MD   1 year ago Elevated brain natriuretic peptide (BNP) level   [facility], Lilia Argue, [place]   1 year ago Other hyperlipidemia   [place], Jeannette How, MD   2 years ago Upper respiratory tract infection, unspecified type   [facility], [place], [place]

## 2019-04-25 ENCOUNTER — Other Ambulatory Visit: Payer: Self-pay | Admitting: Orthopedic Surgery

## 2019-04-25 DIAGNOSIS — M25512 Pain in left shoulder: Secondary | ICD-10-CM

## 2019-05-01 ENCOUNTER — Ambulatory Visit
Admission: RE | Admit: 2019-05-01 | Discharge: 2019-05-01 | Disposition: A | Discharge status: Home / Self Care | Payer: Medicare Other | Source: Ambulatory Visit | Attending: Orthopedic Surgery | Admitting: Orthopedic Surgery

## 2019-05-01 ENCOUNTER — Other Ambulatory Visit: Payer: Self-pay

## 2019-05-01 DIAGNOSIS — M25512 Pain in left shoulder: Secondary | ICD-10-CM | POA: Insufficient documentation

## 2019-06-16 ENCOUNTER — Ambulatory Visit: Payer: Medicare Other | Attending: Internal Medicine

## 2019-06-16 DIAGNOSIS — Z23 Encounter for immunization: Secondary | ICD-10-CM

## 2019-06-16 NOTE — Progress Notes (Signed)
   Covid-19 Vaccination Clinic  Name:  CHRISTERPHER KISHI    MRN: [id number] DOB: 1950-12-24  06/16/2019  Mr. Kolodziej was observed post Covid-19 immunization for 15 minutes without incident. He was provided with Vaccine Information Sheet and instruction to access the V-Safe system.   Mr. Alberta was instructed to call 911 with any severe reactions post vaccine: Marland Kitchen Difficulty breathing  . Swelling of face and throat  . A fast heartbeat  . A bad rash all over body  . Dizziness and weakness   Immunizations Administered    Name Date Dose VIS Date Route   Pfizer COVID-19 Vaccine 06/16/2019 11:26 AM 0.3 mL 03/11/2019 Intramuscular   Manufacturer: [place]   Lot: [id number]   [place]: [id number]

## 2019-06-16 NOTE — Progress Notes (Signed)
   Covid-19 Vaccination Clinic  Name:  Devin Adams    MRN: [id number] DOB: November 09, 1950  06/16/2019  Devin Adams was observed post Covid-19 immunization for 15 minutes without incident. He was provided with Vaccine Information Sheet and instruction to access the V-Safe system.   Devin Adams was instructed to call 911 with any severe reactions post vaccine: Marland Kitchen Difficulty breathing  . Swelling of face and throat  . A fast heartbeat  . A bad rash all over body  . Dizziness and weakness   Immunizations Administered    Name Date Dose VIS Date Route   Pfizer COVID-19 Vaccine 06/16/2019 11:26 AM 0.3 mL 03/11/2019 Intramuscular   Manufacturer: [place]   Lot: [id number]   [place]: [id number]

## 2019-07-12 DIAGNOSIS — F330 Major depressive disorder, recurrent, mild: Secondary | ICD-10-CM | POA: Insufficient documentation

## 2019-07-13 ENCOUNTER — Ambulatory Visit: Payer: Medicare Other | Attending: Internal Medicine

## 2019-07-13 DIAGNOSIS — Z23 Encounter for immunization: Secondary | ICD-10-CM

## 2019-07-13 NOTE — Progress Notes (Signed)
   Covid-19 Vaccination Clinic  Name:  Devin Adams    MRN: [id number] DOB: [birth year]  07/13/2019  [name] was observed post Covid-19 immunization for 15 minutes without incident. He was provided with Vaccine Information Sheet and instruction to access the V-Safe system.   [name] was instructed to call 911 with any severe reactions post vaccine: Marland Kitchen Difficulty breathing  . Swelling of face and throat  . A fast heartbeat  . A bad rash all over body  . Dizziness and weakness   Immunizations Administered    Name Date Dose VIS Date Route   Pfizer COVID-19 Vaccine 07/13/2019 10:18 AM 0.3 mL 03/11/2019 Intramuscular   Manufacturer: [employer]   Lot: [id number]   [place]: [id number]

## 2019-09-27 ENCOUNTER — Ambulatory Visit: Payer: BC Managed Care – PPO | Admitting: Podiatry

## 2019-11-16 ENCOUNTER — Encounter: Payer: Self-pay | Admitting: Podiatry

## 2019-11-16 ENCOUNTER — Other Ambulatory Visit: Payer: Self-pay

## 2019-11-16 ENCOUNTER — Ambulatory Visit (INDEPENDENT_AMBULATORY_CARE_PROVIDER_SITE_OTHER): Payer: Medicare Other | Admitting: Podiatry

## 2019-11-16 DIAGNOSIS — M7752 Other enthesopathy of left foot: Secondary | ICD-10-CM

## 2019-11-16 DIAGNOSIS — G5792 Unspecified mononeuropathy of left lower limb: Secondary | ICD-10-CM

## 2019-11-17 NOTE — Progress Notes (Signed)
Subjective:  Patient ID: Devin Adams, male    DOB: Dec 31, 1950,  MRN: [id number] HPI Chief Complaint  Patient presents with  . Foot Pain    "its the same problem I had before, my left foot flared up about 6 months ago then become better.  It started hurting again about 3 weeks ago and its painful to walk.  Shooting pains    He has used ice and rest which helps some    69 y.o. male presents with the above complaint.   ROS: Denies fever chills nausea vomiting muscle aches pains calf pain back pain chest pain shortness of breath.  Past Medical History:  Diagnosis Date  . Enlarged prostate   . History of kidney stones   . Hyperlipidemia   . Insomnia, unspecified   . Other testicular hypofunction   . Sleep apnea    CPAP   Past Surgical History:  Procedure Laterality Date  . APPENDECTOMY    . COLONOSCOPY WITH PROPOFOL N/A 06/16/2016   Procedure: COLONOSCOPY WITH PROPOFOL;  Surgeon: Lucilla Lame, MD;  Location: [place];  Service: Endoscopy;  Laterality: N/A;  sleep apnea  . ESOPHAGEAL DILATION    . LASER OF PROSTATE W/ GREEN LIGHT PVP    . POLYPECTOMY  06/16/2016   Procedure: POLYPECTOMY;  Surgeon: Lucilla Lame, MD;  Location: [place];  Service: Endoscopy;;  . THYROID CYST EXCISION      Current Outpatient Medications:  .  famotidine (PEPCID) 40 MG tablet, Take by mouth., Disp: , Rfl:  .  lovastatin (MEVACOR) 40 MG tablet, Take by mouth., Disp: , Rfl:  .  cetirizine (ZYRTEC) 10 MG tablet, SMARTSIG:1 Pill By Mouth Daily PRN, Disp: , Rfl:  .  cholecalciferol (VITAMIN D) 1000 units tablet, Take 1,000 Units by mouth daily., Disp: , Rfl:  .  fluticasone (FLONASE) 50 MCG/ACT nasal spray, Place 2 sprays into both nostrils daily., Disp: , Rfl:  .  gabapentin (NEURONTIN) 100 MG capsule, Take 1-2 capsules (100-200 mg total) by mouth at bedtime., Disp: 30 capsule, Rfl: 0 .  loratadine (CLARITIN) 10 MG tablet, TAKE 1 TABLET BY MOUTH EVERY DAY (Patient taking  differently: Take 10 mg by mouth daily as needed. ), Disp: 90 tablet, Rfl: 1 .  Multiple Vitamin (MULTIVITAMIN) tablet, Take 1 tablet by mouth daily., Disp: , Rfl:  .  Omega-3 Fatty Acids (FISH OIL PO), Take by mouth daily., Disp: , Rfl:  .  omeprazole (PRILOSEC) 40 MG capsule, Take 40 mg by mouth daily., Disp: , Rfl:  .  tadalafil (CIALIS) 5 MG tablet, Take by mouth., Disp: , Rfl:  .  tamsulosin (FLOMAX) 0.4 MG CAPS capsule, TAKE 1 CAPSULE BY MOUTH EVERY DAY, Disp: 90 capsule, Rfl: 1  No Known Allergies Review of Systems Objective:  There were no vitals filed for this visit.  General: Well developed, nourished, in no acute distress, alert and oriented x3   Dermatological: Skin is warm, dry and supple bilateral. Nails x 10 are well maintained; remaining integument appears unremarkable at this time. There are no open sores, no preulcerative lesions, no rash or signs of infection present.  Vascular: Dorsalis Pedis artery and Posterior Tibial artery pedal pulses are 2/4 bilateral with immedate capillary fill time. Pedal hair growth present. No varicosities and no lower extremity edema present bilateral.   Neruologic: Grossly intact via light touch bilateral. Vibratory intact via tuning fork bilateral. Protective threshold with Semmes Wienstein monofilament intact to all pedal sites bilateral. Patellar and Achilles deep  tendon reflexes 2+ bilateral. No Babinski or clonus noted bilateral.   Musculoskeletal: No gross boney pedal deformities bilateral. No pain, crepitus, or limitation noted with foot and ankle range of motion bilateral. Muscular strength 5/5 in all groups tested bilateral.  At this point there is pain on palpation of the sinus tarsi and subtalar joint left foot. He also has pain on palpation of the deep peroneal nerve.  Gait: Unassisted, Nonantalgic.    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  Plan: Discussed etiology pathology conservative versus surgical therapies. At this point I went ahead and injected subtalar joint today with 10 mg Kenalog injected the deep peroneal nerve area with 10 mg of Kenalog and he tolerated procedure well I will follow-up with him on an as-needed basis.     Devin Adams, [place]

## 2019-12-21 ENCOUNTER — Ambulatory Visit: Payer: Medicare Other | Admitting: Podiatry

## 2019-12-29 ENCOUNTER — Ambulatory Visit: Payer: Medicare Other | Admitting: Podiatry

## 2020-01-09 ENCOUNTER — Encounter: Payer: Self-pay | Admitting: Podiatry

## 2020-01-09 ENCOUNTER — Ambulatory Visit (INDEPENDENT_AMBULATORY_CARE_PROVIDER_SITE_OTHER): Payer: Medicare Other | Admitting: Podiatry

## 2020-01-09 ENCOUNTER — Other Ambulatory Visit: Payer: Self-pay

## 2020-01-09 DIAGNOSIS — M7752 Other enthesopathy of left foot: Secondary | ICD-10-CM

## 2020-01-09 DIAGNOSIS — G5792 Unspecified mononeuropathy of left lower limb: Secondary | ICD-10-CM | POA: Diagnosis not present

## 2020-01-09 MED ORDER — CYCLOBENZAPRINE HCL 5 MG PO TABS: 5.0000 mg | ORAL_TABLET | Freq: Three times a day (TID) | ORAL | 1 refills | Status: DC | PRN

## 2020-01-09 NOTE — Progress Notes (Signed)
Devin Adams presents today for follow-up of his subtalar joint capsulitis and neuritis of dorsal aspect of the left foot.  States that he really really has not gotten any better at all he states if I turned my foot like this as he everts his forefoot he gets radiating pain up his leg.  He is also having nocturnal cramps every night  Objective: Vital signs are stable he is alert and oriented x3.  Pulses are palpable.  He has no pain on direct palpation of the foot however with the eversion he has pain overlying the fourth fifth MTP joints with radiating pain proximally to the level of the common peroneal nerve.  Review of radiographs do not demonstrate any type of osseous abnormalities.  Assessment: Probable sprain strain or arthritis of the dorsal lateral aspect of the foot possible nerve entrapment.  Nocturnal cramping most likely secondary to gait change  Plan: MRI of the rear foot and ankle left.  Start him on cyclobenzaprine wrote it for 3 times a day but I want him to start at night first to make sure this does well for him

## 2020-01-16 ENCOUNTER — Telehealth: Payer: Self-pay

## 2020-01-16 DIAGNOSIS — G5792 Unspecified mononeuropathy of left lower limb: Secondary | ICD-10-CM

## 2020-01-16 NOTE — Telephone Encounter (Signed)
Per [facility] website, no precert is required for MRI.  Patient has been notified and will call to schedule appt to his own convenience

## 2020-01-17 ENCOUNTER — Telehealth: Payer: Self-pay | Admitting: Podiatry

## 2020-01-17 DIAGNOSIS — M7752 Other enthesopathy of left foot: Secondary | ICD-10-CM

## 2020-01-17 DIAGNOSIS — G5792 Unspecified mononeuropathy of left lower limb: Secondary | ICD-10-CM

## 2020-01-17 NOTE — Addendum Note (Signed)
Addended by: Boneta Lucks on: 01/17/2020 11:50 AM   Modules accepted: Orders

## 2020-01-17 NOTE — Telephone Encounter (Signed)
Incorrect order in epic. Radiology states that is should be an order in [facility]; left foot for the MRI/ not heel. Radiology department needs it done as soon as possible (today) so they can get it approved through insurance. Patient appointment is tomorrow.

## 2020-01-17 NOTE — Telephone Encounter (Signed)
done

## 2020-01-18 ENCOUNTER — Other Ambulatory Visit: Payer: Self-pay

## 2020-01-18 ENCOUNTER — Ambulatory Visit
Admission: RE | Admit: 2020-01-18 | Discharge: 2020-01-18 | Disposition: A | Discharge status: Home / Self Care | Payer: Medicare Other | Source: Ambulatory Visit | Attending: Podiatry | Admitting: Podiatry

## 2020-01-18 DIAGNOSIS — M7752 Other enthesopathy of left foot: Secondary | ICD-10-CM | POA: Insufficient documentation

## 2020-01-18 DIAGNOSIS — G5792 Unspecified mononeuropathy of left lower limb: Secondary | ICD-10-CM | POA: Insufficient documentation

## 2020-01-24 ENCOUNTER — Telehealth: Payer: Self-pay

## 2020-01-24 NOTE — Telephone Encounter (Signed)
Patient was notified of results and has been sent to scheduling for follow up MRI and discuss tx options

## 2020-01-24 NOTE — Telephone Encounter (Signed)
-----   Message from Garrel Ridgel, [place] sent at 01/24/2020  7:31 AM EDT ----- MRI relates arthritis in that joint in question.  Orthotics, injections or antiinflammatories like mobic or voltaren gel might help alleviate the discomfort. ----- Message ----- From: Mack Hook, LPN Sent: 54/27/0623   5:47 PM EDT To: Garrel Ridgel, DPM  Will you please review patient's MRI results and let me know if you want an overread or have patient come in.   It was put in under Dr.Patel because wrong order was entered per Radiology and Dr. Posey Pronto placed the correct order.   Thanks!

## 2020-02-08 ENCOUNTER — Ambulatory Visit: Payer: Medicare Other | Admitting: Podiatry

## 2020-03-25 NOTE — Progress Notes (Signed)
Cardiology Office Note  Date:  03/27/2020   ID:  Devin Adams, DOB 07-25-1950, MRN [id number]  PCP:  Rusty Aus, MD   Chief Complaint  Patient presents with  . Other    12 month f/u no complaints today. Meds reviewed verbally with pt.    HPI:  Devin Adams is a  69 -year-old gentleman  with a history of  hyperlipidemia,   Sleep apnea, uses nasal pillow low testosterone, shortness of breath with heavy exertion with previous stress test in 2008  CT abdomen pelvis from November 2016  no significant aortic atherosclerosis who presents for routine followup of his hyperlipidemia and SOB  Last seen in clinic 01/2019 Retired aug 2020  Summer, weight down to 215, stopped beer, cut sugars, Swims 2x a week, joints better, back better Walks 1-2 a week  Lab work reviewed with him Total chol 220, LDL 138   July 2021 Total chol 168 LDL 87, nov 2021 On lovastatin 5 to 7 days a week  Muscle cramps with exertion, rare Last summer with cramping in his foot lasting 6 weeks Resolved without intervention  CT coronary calcium score (score of zero)  Normal glucose numbers, nondiabetic Nonsmoker  EKG personally reviewed by myself on todays visit normal sinus rhythm with rate 78 bpm, no significant ST or T-wave changes  Other past medical history reviewed CT scan images reviewed with him that he had for kidney stone This shows no significant PAD extending from descending aorta thoracic region down to his femoral arteries. Distal RCA with no coronary calcification  Notes indicate that his stress test 2008 was evaluated by Dr. Verl Blalock and Dr. Percival Spanish at [facility] and it was not felt that he needed further workup such as a cardiac catheterization. The inferior wall was initially read as showing some reversible changes the evaluation of static images by the above doctors did not suggest ischemia. No cardiac catheterization was performed and he has done well since then.  Mom had pacer, dad  with pacer   PMH:   has a past medical history of Enlarged prostate, History of kidney stones, Hyperlipidemia, Insomnia, unspecified, Other testicular hypofunction, and Sleep apnea.  PSH:    Past Surgical History:  Procedure Laterality Date  . APPENDECTOMY    . COLONOSCOPY WITH PROPOFOL N/A 06/16/2016   Procedure: COLONOSCOPY WITH PROPOFOL;  Surgeon: Lucilla Lame, MD;  Location: [facility];  Service: Endoscopy;  Laterality: N/A;  sleep apnea  . ESOPHAGEAL DILATION    . LASER OF PROSTATE W/ GREEN LIGHT PVP    . POLYPECTOMY  06/16/2016   Procedure: POLYPECTOMY;  Surgeon: Lucilla Lame, MD;  Location: [facility];  Service: Endoscopy;;  . THYROID CYST EXCISION      Current Outpatient Medications  Medication Sig Dispense Refill  . cetirizine (ZYRTEC) 10 MG tablet SMARTSIG:1 Pill By Mouth Daily PRN    . cholecalciferol (VITAMIN D) 1000 units tablet Take 1,000 Units by mouth daily.    . cyclobenzaprine (FLEXERIL) 5 MG tablet Take 1 tablet (5 mg total) by mouth 3 (three) times daily as needed for muscle spasms. 30 tablet 1  . etodolac (LODINE) 400 MG tablet Take by mouth.    . famotidine (PEPCID) 40 MG tablet Take 40 mg by mouth as needed.    . fluticasone (FLONASE) 50 MCG/ACT nasal spray Place 2 sprays into both nostrils daily as needed.    . gabapentin (NEURONTIN) 300 MG capsule Take 300 mg by mouth at bedtime.    Marland Kitchen  loratadine (CLARITIN) 10 MG tablet TAKE 1 TABLET BY MOUTH EVERY DAY (Patient taking differently: Take 10 mg by mouth daily as needed.) 90 tablet 1  . lovastatin (MEVACOR) 40 MG tablet Take 40 mg by mouth at bedtime.    Marland Kitchen MAGNESIUM PO Take by mouth daily in the afternoon.    . Multiple Vitamin (MULTIVITAMIN) tablet Take 1 tablet by mouth daily.    . tadalafil (CIALIS) 5 MG tablet Take by mouth.    . tamsulosin (FLOMAX) 0.4 MG CAPS capsule TAKE 1 CAPSULE BY MOUTH EVERY DAY 90 capsule 1   No current facility-administered medications for this visit.      Allergies:   Patient has no known allergies.   Social History:  The patient  reports that he has never smoked. He has never used smokeless tobacco. He reports current alcohol use of about 3.0 standard drinks of alcohol per week. He reports that he does not use drugs.   Family History:   family history includes Brain cancer in his paternal uncle and paternal uncle; Crohn's disease in his son; Heart failure in his father, maternal grandfather, mother, and another family member; Hypertension in his father.    Review of Systems: Review of Systems  Constitutional: Negative.   HENT: Negative.   Respiratory: Negative.   Cardiovascular: Negative.   Gastrointestinal: Negative.   Musculoskeletal: Negative.   Neurological: Negative.   Psychiatric/Behavioral: Negative.   All other systems reviewed and are negative.   PHYSICAL EXAM: VS:  BP 104/60 (BP Location: Left Arm, Patient Position: Sitting, Cuff Size: Normal)   Pulse 78   Ht 5\' 10"  (1.778 m)   Wt 229 lb 12 oz (104.2 kg)   SpO2 97%   BMI 32.97 kg/m  , BMI Body mass index is 32.97 kg/m. Constitutional:  oriented to person, place, and time. No distress.  HENT:  Head: Grossly normal Eyes:  no discharge. No scleral icterus.  Neck: No JVD, no carotid bruits  Cardiovascular: Regular rate and rhythm, no murmurs appreciated Pulmonary/Chest: Clear to auscultation bilaterally, no wheezes or rails Abdominal: Soft.  no distension.  no tenderness.  Musculoskeletal: Normal range of motion Neurological:  normal muscle tone. Coordination normal. No atrophy Skin: Skin warm and dry Psychiatric: normal affect, pleasant  Recent Labs: No results found for requested labs within last 8760 hours.    Lipid Panel Lab Results  Component Value Date   CHOL 221 (H) 10/19/2018   HDL 50 10/19/2018   LDLCALC 138 (H) 10/19/2018   TRIG 191 (H) 10/19/2018      Wt Readings from Last 3 Encounters:  03/27/20 229 lb 12 oz (104.2 kg)  05/01/19 236  lb (107 kg)  02/07/19 236 lb 4 oz (107.2 kg)      ASSESSMENT AND PLAN:  SOB (shortness of breath) - Plan: EKG 12-Lead Recommend weight loss, exercise program  Mixed hyperlipidemia -  Numbers improved with lovastatin 40 daily Taking 5 days out of 7 a week For recurrent muscle cramping could decrease lovastatin and start Zetia  Sleep apnea, unspecified type - Plan: EKG 12-Lead Uses nasal pillow, Recommend exercise and weight loss program  Preventive care CT coronary calcium score of 0 No further work-up needed at this time   Total encounter time more than 25 minutes  Greater than 50% was spent in counseling and coordination of care with the patient    No orders of the defined types were placed in this encounter.    Mila Merry, M.D., Ph.D. 03/27/2020  [place], [place]

## 2020-03-27 ENCOUNTER — Other Ambulatory Visit: Payer: Self-pay

## 2020-03-27 ENCOUNTER — Ambulatory Visit: Payer: Medicare Other | Admitting: Cardiovascular Disease

## 2020-03-27 ENCOUNTER — Encounter: Payer: Self-pay | Admitting: Cardiovascular Disease

## 2020-03-27 VITALS — BP 104/60 | HR 78 | Ht 70.0 in | Wt 229.8 lb

## 2020-03-27 DIAGNOSIS — E782 Mixed hyperlipidemia: Secondary | ICD-10-CM

## 2020-03-27 DIAGNOSIS — G4730 Sleep apnea, unspecified: Secondary | ICD-10-CM

## 2020-03-27 DIAGNOSIS — R0602 Shortness of breath: Secondary | ICD-10-CM | POA: Diagnosis not present

## 2020-03-27 NOTE — Patient Instructions (Signed)
Medication Instructions:  No changes  If you need a refill on your cardiac medications before your next appointment, please call your pharmacy.    Lab work: No new labs needed   If you have labs (blood work) drawn today and your tests are completely normal, you will receive your results only by: . MyChart Message (if you have MyChart) OR . A paper copy in the mail If you have any lab test that is abnormal or we need to change your treatment, we will call you to review the results.   Testing/Procedures: No new testing needed   Follow-Up: At [facility], you and your health needs are our priority.  As part of our continuing mission to provide you with exceptional heart care, we have created designated Provider Care Teams.  These Care Teams include your primary Cardiologist (physician) and Advanced Practice Providers (APPs -  Physician Assistants and Nurse Practitioners) who all work together to provide you with the care you need, when you need it.  . You will need a follow up appointment in 12 months  . Providers on your designated Care Team:   . Christopher Berge, NP . Ryan Dunn, PA-C . Jacquelyn Visser, PA-C  Any Other Special Instructions Will Be Listed Below (If Applicable).  COVID-19 Vaccine Information can be found at: [email or web address] For questions related to vaccine distribution or appointments, please email [email or web address] or call [phone].     

## 2020-08-05 ENCOUNTER — Other Ambulatory Visit: Payer: Self-pay

## 2020-08-05 ENCOUNTER — Ambulatory Visit
Admission: EM | Admit: 2020-08-05 | Discharge: 2020-08-05 | Disposition: A | Discharge status: Home / Self Care | Payer: Medicare Other | Attending: Emergency Medicine | Admitting: Emergency Medicine

## 2020-08-05 DIAGNOSIS — J029 Acute pharyngitis, unspecified: Secondary | ICD-10-CM | POA: Insufficient documentation

## 2020-08-05 DIAGNOSIS — Z20822 Contact with and (suspected) exposure to covid-19: Secondary | ICD-10-CM | POA: Insufficient documentation

## 2020-08-05 DIAGNOSIS — J069 Acute upper respiratory infection, unspecified: Secondary | ICD-10-CM

## 2020-08-05 DIAGNOSIS — R059 Cough, unspecified: Secondary | ICD-10-CM | POA: Diagnosis present

## 2020-08-05 MED ORDER — PREDNISONE 10 MG (21) PO TBPK: ORAL_TABLET | ORAL | 0 refills | Status: DC

## 2020-08-05 MED ORDER — IPRATROPIUM BROMIDE 0.06 % NA SOLN: 2.0000 | Freq: Four times a day (QID) | NASAL | 12 refills | Status: DC

## 2020-08-05 NOTE — Discharge Instructions (Addendum)
Finish your course of cefdinir.  Use the Atrovent nasal spray, 2 squirts up each nostril every 6 hours as needed for nasal congestion and drainage.  Begin the prednisone taper and take it for the next 6 days to help with inflammation of your nasal passages and respiratory symptoms.  Perform sinus irrigation 2-3 times a day with a NeilMed sinus rinse kit and distilled water.  Do not use tap water.  You can use plain over-the-counter Mucinex every 6 hours to break up the stickiness of the mucus so your body can clear it.  Increase your oral fluid intake to thin out your mucus so that is also able for your body to clear more easily.  Take an over-the-counter probiotic, such as Culturelle-align-activia, 1 hour after each dose of antibiotic to prevent diarrhea.  If you develop any new or worsening symptoms return for reevaluation or see your primary care provider.

## 2020-08-05 NOTE — ED Triage Notes (Signed)
Pt sts he was seen by PCP last week and treated with prednisone and abx for allergies. sts on Thursday he been to have a sore throat, nasal congestion and productive cough.

## 2020-08-05 NOTE — ED Provider Notes (Signed)
[facility]    CSN: [id number] Arrival date & time: 08/05/20  0917      History   Chief Complaint Chief Complaint  Patient presents with  . Nasal Congestion    HPI Devin Adams is a 70 y.o. male.   HPI   70 year old male here for evaluation of sore throat, nasal congestion, and cough.  Patient was evaluated on 07/26/2020 by ear nose and throat and started on a 6-day taper of prednisone and cefdinir for what patient says are nasal allergies.  He has finished the prednisone but he is still taking the cefdinir.  Patient reports that he has been dealing with nasal congestion for the [place] the past 2 weeks.  He states that he was better this past week but 3 days ago he started to develop worsening of his symptoms.  He is having some ear pressure and ringing in his ears.  His nasal discharge and mucus production are both white in color.  His cough usually worse first thing in the morning.  Patient denies any fever.  Past Medical History:  Diagnosis Date  . Enlarged prostate   . History of kidney stones   . Hyperlipidemia   . Insomnia, unspecified   . Other testicular hypofunction   . Sleep apnea    CPAP    Patient Active Problem List   Diagnosis Date Noted  . 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 Benign prostatic hyperplasia with nocturia 03/04/2019  . Familial tremor 03/04/2019  . Preventative health care 11/18/2017  . Nocturnal leg cramps 07/08/2017  . Depression 12/28/2014  . Sleep apnea 12/28/2014  . Elevated fasting glucose 05/19/2014  . Obesity 05/03/2013  . BPH (benign prostatic hyperplasia) 09/06/2012  . 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 Urinary hesitancy 09/06/2012  . Hyperlipidemia 03/13/2011  . Advanced care planning/counseling discussion 03/13/2011    Past Surgical History:  Procedure Laterality Date  . APPENDECTOMY    . COLONOSCOPY WITH PROPOFOL N/A 06/16/2016   Procedure: COLONOSCOPY WITH PROPOFOL;   Surgeon: Lucilla Lame, MD;  Location: [facility];  Service: Endoscopy;  Laterality: N/A;  sleep apnea  . ESOPHAGEAL DILATION    . LASER OF PROSTATE W/ GREEN LIGHT PVP    . POLYPECTOMY  06/16/2016   Procedure: POLYPECTOMY;  Surgeon: Lucilla Lame, MD;  Location: [facility];  Service: Endoscopy;;  . THYROID CYST EXCISION         Home Medications    Prior to Admission medications   Medication Sig Start Date End Date Taking? Authorizing Provider  ipratropium (ATROVENT) 0.06 % nasal spray Place 2 sprays into both nostrils 4 (four) times daily. 08/05/20  Yes Margarette Canada, NP  predniSONE (STERAPRED UNI-PAK 21 TAB) 10 MG (21) TBPK tablet Take 6 tablets on day 1, 5 tablets day 2, 4 tablets day 3, 3 tablets day 4, 2 tablets day 5, 1 tablet day 6 08/05/20  Yes Margarette Canada, NP  cefdinir (OMNICEF) 300 MG capsule Take 300 mg by mouth 2 (two) times daily. 07/26/20   [provider]  cetirizine (ZYRTEC) 10 MG tablet SMARTSIG:1 Pill By Mouth Daily PRN 07/21/19   [provider]  cholecalciferol (VITAMIN D) 1000 units tablet Take 1,000 Units by mouth daily.    [provider]  cyclobenzaprine (FLEXERIL) 5 MG tablet Take 1 tablet (5 mg total) by mouth 3 (three) times daily as needed for muscle spasms. 01/09/20   Hyatt, Max T, DPM  etodolac (LODINE) 400 MG tablet  Take by mouth. 03/02/20 03/02/21  [provider]  famotidine (PEPCID) 40 MG tablet Take 40 mg by mouth as needed. 07/12/19 07/11/20  [provider]  fluticasone (FLONASE) 50 MCG/ACT nasal spray Place 2 sprays into both nostrils daily as needed. 07/21/19   [provider]  gabapentin (NEURONTIN) 300 MG capsule Take 300 mg by mouth at bedtime. 02/17/20   [provider]  loratadine (CLARITIN) 10 MG tablet TAKE 1 TABLET BY MOUTH EVERY DAY Patient taking differently: Take 10 mg by mouth daily as needed. 10/11/18   Poulose, Bethel Born, NP  lovastatin (MEVACOR) 40 MG tablet Take 40 mg by  mouth at bedtime. 04/15/19   [provider]  MAGNESIUM PO Take by mouth daily in the afternoon.    [provider]  Multiple Vitamin (MULTIVITAMIN) tablet Take 1 tablet by mouth daily.    [provider]  tadalafil (CIALIS) 5 MG tablet Take by mouth.    [provider]  tamsulosin (FLOMAX) 0.4 MG CAPS capsule TAKE 1 CAPSULE BY MOUTH EVERY DAY 01/13/19   Hubbard Hartshorn, FNP    Family History Family History  Problem Relation Age of Onset  . Heart failure Mother   . Heart failure Father   . Hypertension Father   . Heart failure Other        CHF-family hx on mothers side  . Brain cancer Paternal Uncle   . Brain cancer Paternal Uncle   . Heart failure Maternal Grandfather   . Crohn's disease Son     Social History Social History   Tobacco Use  . Smoking status: Never Smoker  . Smokeless tobacco: Never Used  Vaping Use  . Vaping Use: Never used  Substance Use Topics  . Alcohol use: Yes    Alcohol/week: 3.0 standard drinks    Types: 3 Cans of beer per week    Comment:    . Drug use: No     Allergies   Patient has no known allergies.   Review of Systems Review of Systems  Constitutional: Negative for activity change, appetite change and fever.  HENT: Positive for congestion, ear pain, rhinorrhea, sore throat and tinnitus.   Respiratory: Positive for cough.   Gastrointestinal: Negative for nausea and vomiting.  Skin: Negative for rash.  Hematological: Negative.   Psychiatric/Behavioral: Negative.      Physical Exam Triage Vital Signs ED Triage Vitals [08/05/20 0938]  Enc Vitals Group     BP (!) 154/93     Pulse Rate 68     Resp 16     Temp 98.7 F (37.1 C)     Temp Source Oral     SpO2 98 %     Weight 225 lb (102.1 kg)     Height _0  (1.778 m)     Head Circumference      Peak Flow      Pain Score 0     Pain Loc      Pain Edu?      Excl. in [place]?    No data found.  Updated Vital Signs BP (!) 154/93   Pulse 68    Temp 98.7 F (37.1 C) (Oral)   Resp 16   Ht _1  (1.778 m)   Wt 225 lb (102.1 kg)   SpO2 98%   BMI 32.28 kg/m   Visual Acuity Right Eye Distance:   Left Eye Distance:   Bilateral Distance:    Right Eye Near:   Left  Eye Near:    Bilateral Near:     Physical Exam Vitals reviewed.  Constitutional:      General: He is not in acute distress.    Appearance: Normal appearance. He is normal weight. He is not ill-appearing.  HENT:     Head: Normocephalic and atraumatic.     Right Ear: Tympanic membrane, ear canal and external ear normal.     Left Ear: Tympanic membrane, ear canal and external ear normal.     Nose: Congestion and rhinorrhea present.     Mouth/Throat:     Mouth: Mucous membranes are moist.     Pharynx: Oropharynx is clear. Posterior oropharyngeal erythema present.  Cardiovascular:     Rate and Rhythm: Normal rate.     Pulses: Normal pulses.     Heart sounds: No murmur heard. No gallop.   Pulmonary:     Effort: Pulmonary effort is normal.     Breath sounds: Normal breath sounds. No wheezing, rhonchi or rales.  Musculoskeletal:     Cervical back: Normal range of motion and neck supple.  Lymphadenopathy:     Cervical: No cervical adenopathy.  Skin:    General: Skin is warm and dry.     Capillary Refill: Capillary refill takes less than 2 seconds.     Findings: No erythema or rash.  Neurological:     General: No focal deficit present.     Mental Status: He is alert and oriented to person, place, and time.  Psychiatric:        Mood and Affect: Mood normal.        Behavior: Behavior normal.        Thought Content: Thought content normal.        Judgment: Judgment normal.      UC Treatments / Results  Labs (all labs ordered are listed, but only abnormal results are displayed) Labs Reviewed  SARS CORONAVIRUS 2 (TAT 6-24 HRS)    EKG   Radiology No results found.  Procedures Procedures (including critical care time)  Medications Ordered in  UC Medications - No data to display  Initial Impression / Assessment and Plan / UC Course  I have reviewed the triage vital signs and the nursing notes.  Pertinent labs & imaging results that were available during my care of the patient were reviewed by me and considered in my medical decision making (see chart for details).   Patient is a very pleasant, nontoxic-appearing 70 year old male here for evaluation of worsening sore throat, nasal congestion, and cough.  He reports that he has been suffering with what he calls nasal allergies for the last 2 weeks that included nasal congestion.  He was evaluated 10 days ago by ear nose and throat and was started on a 14-day course of cefdinir and a 6-day taper of prednisone.  Patient still has 6 days worth of cefdinir left.  He states that he felt better while he was on the prednisone but then 3 days ago he started to worsen again.  Patient's physical exam reveals bilateral pearly gray tympanic membranes with a normal light reflex and clear external auditory canals.  Nasal mucosa is mildly erythematous and edematous with clear nasal discharge.  No sinus tenderness to percussion.  Oropharyngeal exam reveals posterior oropharyngeal erythema with yellow postnasal drip.  No cervical lymphadenopathy appreciated on exam.  Lungs are clear to auscultation all fields.  We will have initiate his 14-day course of cefdinir, continue his sinus irrigation but switch from tap  water to distilled water, place him on another 6-day taper of prednisone, and add Atrovent nasal spray to his regimen.  Patient's exam is consistent with an upper respiratory infection, possibly resolving sinus infection though I am unable to view the notes from the ENT as they are not on epic..   Final Clinical Impressions(s) / UC Diagnoses   Final diagnoses:  Upper respiratory tract infection, unspecified type     Discharge Instructions     Finish your course of cefdinir.  Use the Atrovent  nasal spray, 2 squirts up each nostril every 6 hours as needed for nasal congestion and drainage.  Begin the prednisone taper and take it for the next 6 days to help with inflammation of your nasal passages and respiratory symptoms.  Perform sinus irrigation 2-3 times a day with a NeilMed sinus rinse kit and distilled water.  Do not use tap water.  You can use plain over-the-counter Mucinex every 6 hours to break up the stickiness of the mucus so your body can clear it.  Increase your oral fluid intake to thin out your mucus so that is also able for your body to clear more easily.  Take an over-the-counter probiotic, such as Culturelle-align-activia, 1 hour after each dose of antibiotic to prevent diarrhea.  If you develop any new or worsening symptoms return for reevaluation or see your primary care provider.     ED Prescriptions    Medication Sig Dispense Auth. Provider   ipratropium (ATROVENT) 0.06 % nasal spray Place 2 sprays into both nostrils 4 (four) times daily. 15 mL Margarette Canada, NP   predniSONE (STERAPRED UNI-PAK 21 TAB) 10 MG (21) TBPK tablet Take 6 tablets on day 1, 5 tablets day 2, 4 tablets day 3, 3 tablets day 4, 2 tablets day 5, 1 tablet day 6 21 tablet Margarette Canada, NP     PDMP not reviewed this encounter.   Margarette Canada, NP 08/05/20 [phone]

## 2020-08-06 LAB — SARS CORONAVIRUS 2 (TAT 6-24 HRS): SARS Coronavirus 2: NEGATIVE

## 2020-08-12 ENCOUNTER — Ambulatory Visit
Admission: EM | Admit: 2020-08-12 | Discharge: 2020-08-12 | Disposition: A | Discharge status: Home / Self Care | Payer: Medicare Other

## 2020-08-12 ENCOUNTER — Other Ambulatory Visit: Payer: Self-pay

## 2020-08-12 ENCOUNTER — Encounter: Payer: Self-pay | Admitting: Emergency Medicine

## 2020-08-12 DIAGNOSIS — H938X2 Other specified disorders of left ear: Secondary | ICD-10-CM | POA: Diagnosis not present

## 2020-08-12 DIAGNOSIS — J309 Allergic rhinitis, unspecified: Secondary | ICD-10-CM

## 2020-08-12 NOTE — Discharge Instructions (Addendum)
Continue with nasal saline rinses, Atrovent nasal spray, antihistamine/decongestant, Mucinex, humidifier.  Follow-up for any fever or if you develop ear pain.  You have already been on 2 weeks of antibiotics and 2 weeks of corticosteroids.  Do not think it would be beneficial to continue that at this time.

## 2020-08-12 NOTE — ED Triage Notes (Signed)
Pt c/o left ear fullness. Started about 3 days ago. He states he was seen a week ago for URI symptoms and is feeling a little better but no completely well. He states is about to go out of town and wants to feel better.

## 2020-08-12 NOTE — ED Provider Notes (Signed)
[facility]    CSN: [id number] Arrival date & time: 08/12/20  8502      History   Chief Complaint Chief Complaint  Patient presents with  . Ear Fullness    left    HPI Devin Adams is a 70 y.o. male presenting for reduced hearing and muffled sounds of the left ear x3 days.  Patient was seen at [facility] urgent care 1 week ago and treated with prednisone and Atrovent nasal spray for his upper respiratory tract infection.  Patient seen previously on 07/26/2020 by his ENT specialist and started on a 6-day taper of prednisone and 14-day course of cefdinir for sinusitis.  Patient says that his congestion and sore throat have gotten better over the past couple of weeks but now he has this reduced hearing in his left ear.  Patient going out of town on a vacation in the next couple of days and wants to feel better before then.  He denies any associated pain of his left ear and there has not been any drainage.  No fevers.  Admits to mild cough that has generally improved.  Patient has been using the Atrovent nasal spray, over-the-counter antihistamines and Mucinex as well as nasal saline rinses.  He says those do help his symptoms but he is concerned about possible ear infection at this time.  He did have a negative COVID test 1 week ago.  Denies any fevers.  No other concerns.  HPI  Past Medical History:  Diagnosis Date  . Enlarged prostate   . History of kidney stones   . Hyperlipidemia   . Insomnia, unspecified   . Other testicular hypofunction   . Sleep apnea    CPAP    Patient Active Problem List   Diagnosis Date Noted  . Major depressive disorder, recurrent, mild ([place]) 07/12/2019  . Benign prostatic hyperplasia with nocturia 03/04/2019  . Familial tremor 03/04/2019  . Preventative health care 11/18/2017  . Nocturnal leg cramps 07/08/2017  . Depression 12/28/2014  . Sleep apnea 12/28/2014  . Elevated fasting glucose 05/19/2014  . Obesity 05/03/2013  . BPH (benign  prostatic hyperplasia) 09/06/2012  . 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 Urinary hesitancy 09/06/2012  . Hyperlipidemia 03/13/2011  . Advanced care planning/counseling discussion 03/13/2011    Past Surgical History:  Procedure Laterality Date  . APPENDECTOMY    . COLONOSCOPY WITH PROPOFOL N/A 06/16/2016   Procedure: COLONOSCOPY WITH PROPOFOL;  Surgeon: Lucilla Lame, MD;  Location: [place];  Service: Endoscopy;  Laterality: N/A;  sleep apnea  . ESOPHAGEAL DILATION    . LASER OF PROSTATE W/ GREEN LIGHT PVP    . POLYPECTOMY  06/16/2016   Procedure: POLYPECTOMY;  Surgeon: Lucilla Lame, MD;  Location: [place];  Service: Endoscopy;;  . THYROID CYST EXCISION         Home Medications    Prior to Admission medications   Medication Sig Start Date End Date Taking? Authorizing Provider  cefdinir (OMNICEF) 300 MG capsule Take 300 mg by mouth 2 (two) times daily. 07/26/20  Yes [provider]  cetirizine (ZYRTEC) 10 MG tablet SMARTSIG:1 Pill By Mouth Daily PRN 07/21/19  Yes [provider]  cholecalciferol (VITAMIN D) 1000 units tablet Take 1,000 Units by mouth daily.   Yes [provider]  etodolac (LODINE) 400 MG tablet Take by mouth. 03/02/20 03/02/21 Yes [provider]  fluticasone (FLONASE) 50 MCG/ACT nasal spray Place 2 sprays into both nostrils daily as needed. 07/21/19  Yes [provider]  gabapentin (NEURONTIN) 300 MG capsule Take 300 mg by mouth at bedtime. 02/17/20  Yes [provider]  lovastatin (MEVACOR) 40 MG tablet Take 40 mg by mouth at bedtime. 04/15/19  Yes [provider]  MAGNESIUM PO Take by mouth daily in the afternoon.   Yes [provider]  Multiple Vitamin (MULTIVITAMIN) tablet Take 1 tablet by mouth daily.   Yes [provider]  tadalafil (CIALIS) 5 MG tablet Take by mouth.   Yes [provider]  cyclobenzaprine (FLEXERIL) 5 MG tablet Take 1 tablet (5 mg  total) by mouth 3 (three) times daily as needed for muscle spasms. 01/09/20   Hyatt, Max T, DPM  famotidine (PEPCID) 40 MG tablet Take 40 mg by mouth as needed. 07/12/19 07/11/20  [provider]  ipratropium (ATROVENT) 0.06 % nasal spray Place 2 sprays into both nostrils 4 (four) times daily. 08/05/20   Margarette Canada, NP  loratadine (CLARITIN) 10 MG tablet TAKE 1 TABLET BY MOUTH EVERY DAY Patient taking differently: Take 10 mg by mouth daily as needed. 10/11/18   Poulose, Bethel Born, NP  predniSONE (STERAPRED UNI-PAK 21 TAB) 10 MG (21) TBPK tablet Take 6 tablets on day 1, 5 tablets day 2, 4 tablets day 3, 3 tablets day 4, 2 tablets day 5, 1 tablet day 6 08/05/20   Margarette Canada, NP  tamsulosin (FLOMAX) 0.4 MG CAPS capsule TAKE 1 CAPSULE BY MOUTH EVERY DAY 01/13/19   Hubbard Hartshorn, FNP    Family History Family History  Problem Relation Age of Onset  . Heart failure Mother   . Heart failure Father   . Hypertension Father   . Heart failure Other        CHF-family hx on mothers side  . Brain cancer Paternal Uncle   . Brain cancer Paternal Uncle   . Heart failure Maternal Grandfather   . Crohn's disease Son     Social History Social History   Tobacco Use  . Smoking status: Never Smoker  . Smokeless tobacco: Never Used  Vaping Use  . Vaping Use: Never used  Substance Use Topics  . Alcohol use: Yes    Alcohol/week: 3.0 standard drinks    Types: 3 Cans of beer per week    Comment:    . Drug use: No     Allergies   Patient has no known allergies.   Review of Systems Review of Systems  Constitutional: Negative for fatigue and fever.  HENT: Positive for congestion, hearing loss and rhinorrhea. Negative for ear discharge, ear pain, sinus pressure, sinus pain and sore throat.   Respiratory: Positive for cough. Negative for shortness of breath.   Gastrointestinal: Negative for abdominal pain, diarrhea, nausea and vomiting.  Musculoskeletal: Negative for myalgias.   Neurological: Negative for weakness, light-headedness and headaches.  Hematological: Negative for adenopathy.     Physical Exam Triage Vital Signs ED Triage Vitals  Enc Vitals Group     BP 08/12/20 0849 125/81     Pulse Rate 08/12/20 0849 67     Resp 08/12/20 0849 18     Temp 08/12/20 0849 97.9 F (36.6 C)     Temp Source 08/12/20 0849 Oral     SpO2 08/12/20 0849 96 %     Weight 08/12/20 0848 225 lb 1.4 oz (102.1 kg)     Height 08/12/20 0848 5\' 10"  (1.778 m)     Head Circumference --      Peak Flow --  Pain Score 08/12/20 0847 0     Pain Loc --      Pain Edu? --      Excl. in Icard? --    No data found.  Updated Vital Signs BP 125/81 (BP Location: Right Arm)   Pulse 67   Temp 97.9 F (36.6 C) (Oral)   Resp 18   Ht 5\' 10"  (1.778 m)   Wt 225 lb 1.4 oz (102.1 kg)   SpO2 96%   BMI 32.30 kg/m       Physical Exam Vitals and nursing note reviewed.  Constitutional:      General: He is not in acute distress.    Appearance: He is well-developed. He is not diaphoretic.  HENT:     Head: Normocephalic and atraumatic.     Right Ear: Tympanic membrane, ear canal and external ear normal.     Left Ear: Ear canal and external ear normal. A middle ear effusion is present. Tympanic membrane is injected.     Nose: Congestion and rhinorrhea present.     Mouth/Throat:     Pharynx: Uvula midline. Posterior oropharyngeal erythema present. No oropharyngeal exudate.     Tonsils: No tonsillar abscesses.  Eyes:     General: No scleral icterus.       Right eye: No discharge.        Left eye: No discharge.     Conjunctiva/sclera: Conjunctivae normal.     Pupils: Pupils are equal, round, and reactive to light.  Neck:     Thyroid: No thyromegaly.     Trachea: No tracheal deviation.  Cardiovascular:     Rate and Rhythm: Normal rate and regular rhythm.     Heart sounds: Normal heart sounds.  Pulmonary:     Effort: Pulmonary effort is normal. No respiratory distress.     Breath  sounds: Normal breath sounds. No stridor. No wheezing or rales.  Chest:     Chest wall: No tenderness.  Musculoskeletal:     Cervical back: Normal range of motion and neck supple.  Lymphadenopathy:     Cervical: No cervical adenopathy.  Skin:    General: Skin is warm and dry.     Findings: No rash.  Neurological:     Mental Status: He is alert.      UC Treatments / Results  Labs (all labs ordered are listed, but only abnormal results are displayed) Labs Reviewed - No data to display  EKG   Radiology No results found.  Procedures Procedures (including critical care time)  Medications Ordered in UC Medications - No data to display  Initial Impression / Assessment and Plan / UC Course  I have reviewed the triage vital signs and the nursing notes.  Pertinent labs & imaging results that were available during my care of the patient were reviewed by me and considered in my medical decision making (see chart for details).   70 year old male presenting for left ear fullness and pressure x3 days.  Patient has already been treated with 2 weeks of prednisone and 2 weeks of cefdinir.  He is also using Atrovent nasal spray, nasal saline rinses, over-the-counter antihistamine and Mucinex.  Examination of ear today reveals effusion of the left TM.  No sign of ear infection.  Also, he just completed his cefdinir yesterday.  Advised patient that I do not believe adding more prednisone and more antibiotics will be beneficial for him.  Advised him that he should continue with his antihistamines and nasal sprays and  this should get better over the next couple of weeks, but can take some time to resolve.  Advised him to follow back up with [place] or other urgent care if he develops a fever or pain in the ear or any other acute worsening symptoms.   Final Clinical Impressions(s) / UC Diagnoses   Final diagnoses:  Ear fullness, left  Allergic rhinitis, unspecified seasonality, unspecified trigger      Discharge Instructions     Continue with nasal saline rinses, Atrovent nasal spray, antihistamine/decongestant, Mucinex, humidifier.  Follow-up for any fever or if you develop ear pain.  You have already been on 2 weeks of antibiotics and 2 weeks of corticosteroids.  Do not think it would be beneficial to continue that at this time.    ED Prescriptions    None     PDMP not reviewed this encounter.   Danton Clap, PA-C 08/12/20 1010

## 2020-08-22 ENCOUNTER — Other Ambulatory Visit: Payer: Self-pay | Admitting: Otolaryngology

## 2020-08-22 ENCOUNTER — Ambulatory Visit
Admission: RE | Admit: 2020-08-22 | Discharge: 2020-08-22 | Disposition: A | Discharge status: Home / Self Care | Payer: Medicare Other | Source: Ambulatory Visit | Attending: Otolaryngology | Admitting: Otolaryngology

## 2020-08-22 ENCOUNTER — Ambulatory Visit
Admission: RE | Admit: 2020-08-22 | Discharge: 2020-08-22 | Disposition: A | Discharge status: Home / Self Care | Payer: Medicare Other | Attending: Otolaryngology | Admitting: Otolaryngology

## 2020-08-22 DIAGNOSIS — R059 Cough, unspecified: Secondary | ICD-10-CM

## 2021-10-03 ENCOUNTER — Other Ambulatory Visit: Payer: Self-pay | Admitting: Student

## 2021-10-03 DIAGNOSIS — M7581 Other shoulder lesions, right shoulder: Secondary | ICD-10-CM

## 2021-10-03 DIAGNOSIS — M7521 Bicipital tendinitis, right shoulder: Secondary | ICD-10-CM

## 2021-10-03 DIAGNOSIS — G8929 Other chronic pain: Secondary | ICD-10-CM

## 2021-10-15 ENCOUNTER — Ambulatory Visit
Admission: RE | Admit: 2021-10-15 | Discharge: 2021-10-15 | Disposition: A | Discharge status: Home / Self Care | Payer: Medicare Other | Source: Ambulatory Visit | Attending: Student | Admitting: Student

## 2021-10-15 DIAGNOSIS — M7581 Other shoulder lesions, right shoulder: Secondary | ICD-10-CM

## 2021-10-15 DIAGNOSIS — G8929 Other chronic pain: Secondary | ICD-10-CM

## 2021-10-15 DIAGNOSIS — M7521 Bicipital tendinitis, right shoulder: Secondary | ICD-10-CM

## 2021-10-22 ENCOUNTER — Encounter
Admission: RE | Admit: 2021-10-22 | Discharge: 2021-10-22 | Disposition: A | Discharge status: Home / Self Care | Payer: Medicare Other | Source: Ambulatory Visit | Attending: Urology | Admitting: Urology

## 2021-10-22 ENCOUNTER — Other Ambulatory Visit: Payer: Self-pay

## 2021-10-22 VITALS — Ht 70.0 in | Wt 215.0 lb

## 2021-10-22 DIAGNOSIS — E782 Mixed hyperlipidemia: Secondary | ICD-10-CM

## 2021-10-22 DIAGNOSIS — G4730 Sleep apnea, unspecified: Secondary | ICD-10-CM

## 2021-10-22 DIAGNOSIS — E6609 Other obesity due to excess calories: Secondary | ICD-10-CM

## 2021-10-22 NOTE — Patient Instructions (Signed)
Your procedure is scheduled on: 10/31/21 Report to [place]. To find out your arrival time please call [phone] between 1PM - 3PM on 10/30/21.  Remember: Instructions that are not followed completely may result in serious medical risk, up to and including death, or upon the discretion of your surgeon and anesthesiologist your surgery may need to be rescheduled.     _X__ 1. Do not eat food after midnight the night before your procedure.                 No gum chewing or hard candies. You may drink clear liquids up to 2 hours                 before you are scheduled to arrive for your surgery- DO not drink clear                 liquids within 2 hours of the start of your surgery.                 Clear Liquids include:  water, apple juice without pulp, clear carbohydrate                 drink such as Clearfast or Gatorade, Black Coffee or Tea (Do not add                 anything to coffee or tea). Diabetics water only  __X__2.  On the morning of surgery brush your teeth with toothpaste and water, you                 may rinse your mouth with mouthwash if you wish.  Do not swallow any              toothpaste of mouthwash.     _X__ 3.  No Alcohol for 24 hours before or after surgery.   _X__ 4.  Do Not Smoke or use e-cigarettes For 24 Hours Prior to Your Surgery.                 Do not use any chewable tobacco products for at least 6 hours prior to                 surgery.  ____  5.  Bring all medications with you on the day of surgery if instructed.   __X__  6.  Notify your doctor if there is any change in your medical condition      (cold, fever, infections).     Do not wear jewelry, make-up, hairpins, clips or nail polish. Do not wear lotions, powders, or perfumes.  Do not shave 48 hours prior to surgery. Men may shave face and neck. Do not bring valuables to the hospital.    [facility] is not responsible for any belongings or  valuables.  Contacts, dentures/partials or body piercings may not be worn into surgery. Bring a case for your contacts, glasses or hearing aids, a denture cup will be supplied. Leave your suitcase in the car. After surgery it may be brought to your room. For patients admitted to the hospital, discharge time is determined by your treatment team.   Patients discharged the day of surgery will not be allowed to drive home.    __X__ Take these medicines the morning of surgery with A SIP OF WATER:    1. famotidine (PEPCID) 40 MG tablet  2. tamsulosin (FLOMAX) 0.4 MG CAPS capsule  3.   4.  5.  6.  ____ Fleet Enema (as directed)   ____ Use CHG Soap/SAGE wipes as directed  ____ Use inhalers on the day of surgery  ____ Stop metformin/Janumet/Farxiga 2 days prior to surgery    ____ Take 1/2 of usual insulin dose the night before surgery. No insulin the morning          of surgery.   ____ Stop Blood Thinners Coumadin/Plavix/Xarelto/Pleta/Pradaxa/Eliquis/Effient/Aspirin  on   Or contact your Surgeon, Cardiologist or Medical Doctor regarding  ability to stop your blood thinners  __X__ Stop Anti-inflammatories 7 days before surgery such as Advil, Ibuprofen, Motrin,  BC or Goodies Powder, Naprosyn, Naproxen, Aleve, Aspirin MAY TAKE TYLENOL IF NEEDED   __X__ Stop all herbals and supplements, fish oil or vitamins for 1 week until after surgery.    __X__ Bring C-Pap to the hospital.

## 2021-10-25 NOTE — H&P (Unsigned)
Devin Adams, Devin Adams MEDICAL RECORD NO: [id number] ACCOUNT NO: [id number] DATE OF BIRTH: 11/30/1950 FACILITY: [facility] LOCATION: [facility] PHYSICIAN: Otelia Limes. Yves Dill, MD  History and Physical   DATE OF ADMISSION: 10/31/2021  CHIEF COMPLAINT:  Difficulty voiding.  HISTORY OF PRESENT ILLNESS:  The patient is a 71 year old white male with BPH with lower urinary tract symptoms that has not responded to tamsulosin treatment.  He underwent GreenLight laser vaporization back in 2015.  Evaluation in the office included  cystoscopy, which showed 3 cm of lateral lobe prostatic hypertrophy with obstruction.  Uroflow study indicated a maximum flow rate of 19 mL per second, average flow rate 13 mL per second.  A prostate ultrasound dated 07/10 indicated prostate volume of  26.4 mL. IPSS score was 14 with a quality of life score of 3.  Most recent PSA was 1.1 ng on 06/20.  He is also taking tadalafil 5 mg daily without improvement in his urinary symptoms and no longer effective for ED.  PAST MEDICAL HISTORY:  ALLERGIES:  No drug allergies.  CURRENT MEDICATIONS:  Included lovastatin and gabapentin.  PAST SURGICAL HISTORY: 1.  Appendectomy in 2010. 2.  Removal of thyroid gland cyst in 2012. 3.  Photovaporization of prostate with GreenLight laser in 2015. 4.  TUMT in 2015.  PAST AND CURRENT MEDICAL CONDITIONS: 1.  Sleep apnea -- uses CPAP machine. 2.  History of kidney stones. 3.  Erectile dysfunction. 4.  Chronic back pain. 5.  Peyronie's disease.  REVIEW OF SYSTEMS:  The patient denied chest pain, shortness of breath, diabetes or stroke.  FAMILY HISTORY:  Positive for hypertension.  There is no family history of prostate cancer.  PHYSICAL EXAMINATION: VITAL SIGNS:  Height was 5 feet 9 inches, weight 217 pounds, BMI 32. GENERAL:  Obese white male in no acute distress. HEENT:  Sclerae were clear.  Pupils are equally round, reactive to light and accommodation.  Extraocular movements are  intact. NECK:  No palpable masses. PULMONARY:.  Lungs clear to auscultation. CARDIOVASCULAR:  Regular rhythm and rate. ABDOMEN:  Soft, nontender abdomen. GENITOURINARY:  Circumcised.  Testes smooth, nontender, 20 mL size each. RECTAL:  30 gram, smooth, nontender prostate. NEUROMUSCULAR:  Alert and oriented x3.  IMPRESSION:  Benign prostatic hypertrophy with bladder outlet obstruction and lower urinary tract symptoms.  PLAN:  Photovaporization of prostate with GreenLight laser.   NIK D: 10/25/2021 8:34:57 am T: 10/25/2021 9:06:00 am  JOB: [id number]/ [id number]

## 2021-10-28 ENCOUNTER — Encounter
Admission: RE | Admit: 2021-10-28 | Discharge: 2021-10-28 | Disposition: A | Discharge status: Home / Self Care | Payer: Medicare Other | Source: Ambulatory Visit | Attending: Urology | Admitting: Urology

## 2021-10-28 DIAGNOSIS — Z01810 Encounter for preprocedural cardiovascular examination: Secondary | ICD-10-CM | POA: Diagnosis present

## 2021-10-28 DIAGNOSIS — G4730 Sleep apnea, unspecified: Secondary | ICD-10-CM | POA: Diagnosis not present

## 2021-10-28 DIAGNOSIS — Z6833 Body mass index (BMI) 33.0-33.9, adult: Secondary | ICD-10-CM | POA: Insufficient documentation

## 2021-10-28 DIAGNOSIS — E6609 Other obesity due to excess calories: Secondary | ICD-10-CM | POA: Diagnosis not present

## 2021-10-28 DIAGNOSIS — E782 Mixed hyperlipidemia: Secondary | ICD-10-CM | POA: Insufficient documentation

## 2021-10-30 MED ORDER — LACTATED RINGERS IV SOLN: INTRAVENOUS | Status: DC

## 2021-10-30 MED ORDER — CHLORHEXIDINE GLUCONATE 0.12 % MT SOLN
15.0000 mL | Freq: Once | OROMUCOSAL | Status: AC
  Administered 2021-10-31: 15 mL via OROMUCOSAL

## 2021-10-30 MED ORDER — ORAL CARE MOUTH RINSE: 15.0000 mL | Freq: Once | OROMUCOSAL | Status: AC

## 2021-10-30 MED ORDER — CEFAZOLIN SODIUM-DEXTROSE 1-4 GM/50ML-% IV SOLN
1.0000 g | Freq: Once | INTRAVENOUS | Status: AC
  Administered 2021-10-31: 1 g via INTRAVENOUS

## 2021-10-31 ENCOUNTER — Ambulatory Visit: Payer: Medicare Other | Admitting: Certified Registered Nurse Anesthetist

## 2021-10-31 ENCOUNTER — Other Ambulatory Visit: Payer: Self-pay

## 2021-10-31 ENCOUNTER — Encounter
Admission: RE | Disposition: A | Discharge status: Home / Self Care | Payer: Self-pay | Source: Home / Self Care | Attending: Urology

## 2021-10-31 ENCOUNTER — Encounter: Payer: Self-pay | Admitting: Urology

## 2021-10-31 ENCOUNTER — Ambulatory Visit
Admission: RE | Admit: 2021-10-31 | Discharge: 2021-10-31 | Disposition: A | Discharge status: Home / Self Care | Payer: Medicare Other | Attending: Urology | Admitting: Urology

## 2021-10-31 DIAGNOSIS — Z79899 Other long term (current) drug therapy: Secondary | ICD-10-CM | POA: Diagnosis not present

## 2021-10-31 DIAGNOSIS — N401 Enlarged prostate with lower urinary tract symptoms: Secondary | ICD-10-CM | POA: Diagnosis present

## 2021-10-31 DIAGNOSIS — N320 Bladder-neck obstruction: Secondary | ICD-10-CM | POA: Diagnosis not present

## 2021-10-31 DIAGNOSIS — N138 Other obstructive and reflux uropathy: Secondary | ICD-10-CM | POA: Diagnosis not present

## 2021-10-31 DIAGNOSIS — G4730 Sleep apnea, unspecified: Secondary | ICD-10-CM | POA: Diagnosis not present

## 2021-10-31 HISTORY — PX: GREEN LIGHT LASER TURP (TRANSURETHRAL RESECTION OF PROSTATE: SHX6260

## 2021-10-31 SURGERY — GREEN LIGHT LASER TURP (TRANSURETHRAL RESECTION OF PROSTATE
Anesthesia: General | Site: Prostate

## 2021-10-31 MED ORDER — EPHEDRINE SULFATE (PRESSORS) 50 MG/ML IJ SOLN
INTRAMUSCULAR | Status: DC | PRN
  Administered 2021-10-31 (×2): 5 mg via INTRAVENOUS
  Administered 2021-10-31: 10 mg via INTRAVENOUS

## 2021-10-31 MED ORDER — OXYCODONE HCL 5 MG PO TABS: 5.0000 mg | ORAL_TABLET | Freq: Once | ORAL | Status: DC | PRN

## 2021-10-31 MED ORDER — DROPERIDOL 2.5 MG/ML IJ SOLN: 0.6250 mg | Freq: Once | INTRAMUSCULAR | Status: DC | PRN

## 2021-10-31 MED ORDER — MIDAZOLAM HCL 2 MG/2ML IJ SOLN
INTRAMUSCULAR | Status: DC | PRN
  Administered 2021-10-31: 2 mg via INTRAVENOUS

## 2021-10-31 MED ORDER — ACETAMINOPHEN 10 MG/ML IV SOLN
INTRAVENOUS | Status: AC
  Filled 2021-10-31: qty 100

## 2021-10-31 MED ORDER — DEXMEDETOMIDINE (PRECEDEX) IN NS 20 MCG/5ML (4 MCG/ML) IV SYRINGE
PREFILLED_SYRINGE | INTRAVENOUS | Status: DC | PRN
  Administered 2021-10-31: 4 ug via INTRAVENOUS

## 2021-10-31 MED ORDER — FENTANYL CITRATE (PF) 100 MCG/2ML IJ SOLN
INTRAMUSCULAR | Status: AC
  Filled 2021-10-31: qty 2

## 2021-10-31 MED ORDER — LIDOCAINE HCL (CARDIAC) PF 100 MG/5ML IV SOSY
PREFILLED_SYRINGE | INTRAVENOUS | Status: DC | PRN
  Administered 2021-10-31: 100 mg via INTRAVENOUS

## 2021-10-31 MED ORDER — FENTANYL CITRATE (PF) 100 MCG/2ML IJ SOLN: 25.0000 ug | INTRAMUSCULAR | Status: DC | PRN

## 2021-10-31 MED ORDER — ACETAMINOPHEN 10 MG/ML IV SOLN: 1000.0000 mg | Freq: Once | INTRAVENOUS | Status: DC | PRN

## 2021-10-31 MED ORDER — MIDAZOLAM HCL 2 MG/2ML IJ SOLN
INTRAMUSCULAR | Status: AC
  Filled 2021-10-31: qty 2

## 2021-10-31 MED ORDER — LIDOCAINE HCL URETHRAL/MUCOSAL 2 % EX GEL
CUTANEOUS | Status: DC | PRN
  Administered 2021-10-31: 1 via URETHRAL

## 2021-10-31 MED ORDER — FENTANYL CITRATE (PF) 100 MCG/2ML IJ SOLN
INTRAMUSCULAR | Status: DC | PRN
  Administered 2021-10-31: 50 ug via INTRAVENOUS

## 2021-10-31 MED ORDER — CEFAZOLIN SODIUM-DEXTROSE 1-4 GM/50ML-% IV SOLN
INTRAVENOUS | Status: AC
  Filled 2021-10-31: qty 50

## 2021-10-31 MED ORDER — ONDANSETRON HCL 4 MG/2ML IJ SOLN
INTRAMUSCULAR | Status: AC
  Filled 2021-10-31: qty 2

## 2021-10-31 MED ORDER — DOCUSATE SODIUM 100 MG PO CAPS: 200.0000 mg | ORAL_CAPSULE | Freq: Two times a day (BID) | ORAL | 3 refills | Status: DC

## 2021-10-31 MED ORDER — DEXAMETHASONE SODIUM PHOSPHATE 10 MG/ML IJ SOLN
INTRAMUSCULAR | Status: DC | PRN
  Administered 2021-10-31: 10 mg via INTRAVENOUS

## 2021-10-31 MED ORDER — PROPOFOL 10 MG/ML IV BOLUS
INTRAVENOUS | Status: AC
  Filled 2021-10-31: qty 20

## 2021-10-31 MED ORDER — PROMETHAZINE HCL 25 MG/ML IJ SOLN: 6.2500 mg | INTRAMUSCULAR | Status: DC | PRN

## 2021-10-31 MED ORDER — ACETAMINOPHEN 10 MG/ML IV SOLN
INTRAVENOUS | Status: DC | PRN
  Administered 2021-10-31: 1000 mg via INTRAVENOUS

## 2021-10-31 MED ORDER — OXYCODONE HCL 5 MG/5ML PO SOLN: 5.0000 mg | Freq: Once | ORAL | Status: DC | PRN

## 2021-10-31 MED ORDER — CHLORHEXIDINE GLUCONATE 0.12 % MT SOLN
OROMUCOSAL | Status: AC
  Filled 2021-10-31: qty 15

## 2021-10-31 MED ORDER — CIPROFLOXACIN HCL 500 MG PO TABS: 500.0000 mg | ORAL_TABLET | Freq: Two times a day (BID) | ORAL | 0 refills | Status: DC

## 2021-10-31 MED ORDER — SODIUM CHLORIDE 0.9 % IR SOLN
Status: DC | PRN
  Administered 2021-10-31 (×4): 3000 mL via INTRAVESICAL

## 2021-10-31 MED ORDER — LIDOCAINE HCL URETHRAL/MUCOSAL 2 % EX GEL
CUTANEOUS | Status: AC
  Filled 2021-10-31: qty 10

## 2021-10-31 MED ORDER — ROCURONIUM BROMIDE 100 MG/10ML IV SOLN
INTRAVENOUS | Status: DC | PRN
  Administered 2021-10-31: 50 mg via INTRAVENOUS

## 2021-10-31 MED ORDER — URIBEL 118 MG PO CAPS: 1.0000 | ORAL_CAPSULE | Freq: Four times a day (QID) | ORAL | 3 refills | Status: DC | PRN

## 2021-10-31 MED ORDER — DEXAMETHASONE SODIUM PHOSPHATE 10 MG/ML IJ SOLN
INTRAMUSCULAR | Status: AC
  Filled 2021-10-31: qty 1

## 2021-10-31 MED ORDER — ONDANSETRON HCL 4 MG/2ML IJ SOLN
INTRAMUSCULAR | Status: DC | PRN
  Administered 2021-10-31: 4 mg via INTRAVENOUS

## 2021-10-31 MED ORDER — SUGAMMADEX SODIUM 200 MG/2ML IV SOLN
INTRAVENOUS | Status: DC | PRN
  Administered 2021-10-31: 200 mg via INTRAVENOUS

## 2021-10-31 MED ORDER — PROPOFOL 10 MG/ML IV BOLUS
INTRAVENOUS | Status: DC | PRN
  Administered 2021-10-31: 150 mg via INTRAVENOUS
  Administered 2021-10-31: 20 mg via INTRAVENOUS

## 2021-10-31 SURGICAL SUPPLY — 27 items
ADAPTER IRRIG TUBE 2 SPIKE SOL (ADAPTER) ×4 IMPLANT
BAG URINE DRAIN 2000ML AR STRL (UROLOGICAL SUPPLIES) ×2 IMPLANT
CATH FOLEY 2WAY  5CC 20FR SIL (CATHETERS)
CATH FOLEY 2WAY 5CC 20FR SIL (CATHETERS) IMPLANT
CYSTOSCOPE CON FLOW (MISCELLANEOUS) ×2 IMPLANT
FEE RENTAL LASER GREENLIGHT (Laser) ×1 IMPLANT
GAUZE 4X4 16PLY ~~LOC~~+RFID DBL (SPONGE) ×4 IMPLANT
GLOVE BIO SURGEON STRL SZ7.5 (GLOVE) ×2 IMPLANT
GOWN STRL REUS W/ TWL LRG LVL3 (GOWN DISPOSABLE) ×1 IMPLANT
GOWN STRL REUS W/ TWL XL LVL3 (GOWN DISPOSABLE) ×1 IMPLANT
GOWN STRL REUS W/TWL LRG LVL3 (GOWN DISPOSABLE) ×1
GOWN STRL REUS W/TWL XL LVL3 (GOWN DISPOSABLE) ×1
IV NS 1000ML (IV SOLUTION) ×1
IV NS 1000ML BAXH (IV SOLUTION) ×1 IMPLANT
IV NS IRRIG 3000ML ARTHROMATIC (IV SOLUTION) ×8 IMPLANT
IV SET PRIMARY 15D 139IN B9900 (IV SETS) ×2 IMPLANT
KIT TURNOVER CYSTO (KITS) ×2 IMPLANT
LASER FIBER /GREENLIGHT LASER (Laser) ×2 IMPLANT
LASER GREENLIGHT RENTAL P/PROC (Laser) ×2 IMPLANT
MANIFOLD NEPTUNE II (INSTRUMENTS) ×2 IMPLANT
MoXy Liquid Cooled Laser Fiber IMPLANT
PACK CYSTO AR (MISCELLANEOUS) ×2 IMPLANT
SET IRRIG Y TYPE TUR BLADDER L (SET/KITS/TRAYS/PACK) ×2 IMPLANT
SURGILUBE 2OZ TUBE FLIPTOP (MISCELLANEOUS) ×2 IMPLANT
SYR TOOMEY IRRIG 70ML (MISCELLANEOUS) ×2
SYRINGE TOOMEY IRRIG 70ML (MISCELLANEOUS) ×1 IMPLANT
WATER STERILE IRR 1000ML POUR (IV SOLUTION) ×2 IMPLANT

## 2021-10-31 NOTE — Discharge Instructions (Addendum)
[facility] Laser Prostate Treatment, Care After The following information offers guidance on how to care for yourself after your procedure. Your health care provider may also give you more specific instructions. If you have problems or questions, contact your health care provider. What can I expect after the procedure? After the procedure, it is common to have these symptoms for a few days: Swelling and discomfort around your urethra. Blood in your urine. A burning feeling when you urinate after the urinary catheter is removed. You will feel this especially at the end of urination. This feeling usually passes within 3-5 days. For the first few weeks after the procedure, you may still feel: A sudden need to urinate (urgency). A need to urinate often. Follow these instructions at home: Medicines Take over-the-counter and prescription medicines only as told by your health care provider. These medicines may include stool softeners. If you were prescribed an antibiotic medicine, take it as told by your health care provider. Do not stop taking the antibiotic even if you start to feel better. Bathing Do not take baths, swim, or use a hot tub until your health care provider approves. Ask your health care provider if you may take showers. You may only be allowed to take sponge baths. Activity A sign showing that a person should not drive.   Rest as told by your health care provider. Do not drive or operate machinery until your health care provider says that it is safe. Do not ride in a car for long periods of time, or as told by your health care provider. Do not do strenuous exercises for 1 week or as told by your health care provider. Strenuous exercises are exercises that require a lot of effort. Do not lift anything that is heavier than 10 lb (4.5 kg), or the limit that you are told, until your health care provider says that it is safe. Avoid sex for 4-6 weeks, or as told by your health care  provider. Return to your normal activities as told by your health care provider. Ask your health care provider what activities are safe for you. Preventing constipation You may need to take these actions to prevent or treat constipation: Drink enough fluid to keep your urine pale yellow. Take over-the-counter or prescription medicines. Eat foods that are high in fiber, such as beans, whole grains, and fresh fruits and vegetables. Limit foods that are high in fat and processed sugars, such as fried or sweet foods. General instructions Three cups showing dark yellow, yellow, and pale yellow urine.   Do not strain when you have a bowel movement. Straining may lead to bleeding from the prostate. This may cause blood clots and trouble urinating. Do not use any products that contain nicotine or tobacco. These products include cigarettes, chewing tobacco, and vaping devices, such as e-cigarettes. If you need help quitting, ask your health care provider. If you have a urinary catheter, care for it as told by your health care provider. Keep all follow-up visits. This is important. Contact a health care provider if: You have signs of infection, such as: Fever or chills. Swelling around your urethra that is getting worse. Urine that smells very bad. Struggling to urinate or pain or burning when you urinate. You have trouble having a bowel movement. You have blood in your urine for more than 2 days after the procedure. You have trouble having or keeping an erection. No semen comes out during orgasm (dry ejaculation). You have a urinary catheter still  in place and you have: Spasms or pain. Problems with the catheter or your catheter is blocked. Get help right away if: You cannot urinate after your catheter is removed. Your urine is dark red or has blood clots in it. You have blood in your stool. You have severe pain that does not get better with medicine. You develop swelling or pain in your  leg. You develop chest pains or shortness of breath. These symptoms may be an emergency. Get help right away. Call 911. Do not wait to see if the symptoms will go away. Do not drive yourself to the hospital. Summary After the procedure, it is common to have swelling and discomfort around your urethra and blood in your urine for a few days. Some men may have problems urinating after this procedure. These problems should go away after a few days. If you have pain or burning while urinating, contact your health care provider. If you have a catheter after this procedure, care for it as told by your health care provider. If you have severe pain, dark red urine, or urine with blood clots, get medical help right away. This information is not intended to replace advice given to you by your health care provider. Make sure you discuss any questions you have with your health care provider. Document Revised: 12/07/2020 Document Reviewed: 12/07/2020 Elsevier Patient Education  [place], Adult An indwelling urinary catheter is a thin tube that is put into your bladder. The tube helps to drain pee (urine) out of your body. The tube goes in through your urethra. Your urethra is where pee comes out of your body. Your pee will come out through the catheter, then it will go into a bag (drainage bag). Take good care of your catheter so it will work well. What are the risks? Germs may get into your bladder and cause an infection. The tube can become blocked. Tissue near the catheter may become irritated and may bleed. How to wear your catheter and drainage bag Supplies needed Sticky tape (adhesive tape) or a leg strap. Alcohol wipe or soap and water (if you use tape). A clean towel (if you use tape). Large overnight bag. Smaller bag (leg bag). Wearing your catheter Attach your catheter to your leg with tape or a leg strap. Make sure the catheter is not pulled  tight. If a leg strap gets wet, take it off and put on a dry strap. If you use tape to hold the bag on your leg: Use an alcohol wipe or soap and water to wash your skin where the tape made it sticky before. Use a clean towel to pat-dry that skin. Use new tape to make the bag stay on your leg. Wearing your bags You should have been given a large overnight bag. You may wear the overnight bag in the day or night. Always have the overnight bag lower than your bladder.  Do not let the bag touch the floor. Before you go to sleep, put a clean plastic bag in a wastebasket. Then, hang the overnight bag inside the wastebasket. You should also have a smaller leg bag that fits under your clothes. Wear the leg bag as told by the product maker. This may be above or below the knee, depending on the length of the tubing. Make sure that the leg bag is below the bladder. Make sure that the tubing does not have loops or too much tension. Do  not wear your leg bag at night. How to care for your skin and catheter Supplies needed A clean washcloth. Water and mild soap. A clean towel. Caring for your skin and catheter Male anatomy showing the labia, urethra, and an indwelling urinary catheter in the bladder.     Male anatomy showing the penis, urethra, and an indwelling urinary catheter in the bladder.   Clean the skin around your catheter every day. Wash your hands with soap and water. Wet a clean washcloth in warm water and mild soap. Clean the skin around your urethra. If you are male: Gently spread the folds of skin around your vagina (labia). With the washcloth in your other hand, wipe the inner side of your labia on each side. Wipe from front to back. If you are male: Pull back any skin that covers the end of your penis (foreskin). With the washcloth in your other hand, wipe your penis in small circles. Start wiping at the tip of your penis, then move away from the catheter. Move the  foreskin back in place, if needed. With your free hand, hold the catheter close to where it goes into your body. Keep holding the catheter during cleaning so it does not get pulled out. With the washcloth in your other hand, clean the catheter. Only wipe downward on the catheter, toward the drainage bag. Do not wipe upward toward your body. Doing this may push germs into your urethra and cause infection. Use a clean towel to pat-dry the catheter and the skin around it. Make sure to wipe off all soap. Wash your hands with soap and water. Shower every day. Do not take baths. Do not use cream, ointment, or lotion on the area where the catheter goes into your body, unless your doctor tells you to. Do not use powders, sprays, or lotions on your genital area. Check your skin around the catheter every day for signs of infection. Check for: Redness, swelling, or pain. Fluid or blood. Warmth. Pus or a bad smell. How to empty the bag Supplies needed Rubbing alcohol. Gauze pad or cotton ball. Tape or a leg strap. Emptying the bag Pour the pee out of your bag when it is ?- full, or at least 2-3 times a day. Do this for your overnight bag and your leg bag. Wash your hands with soap and water. Separate (detach) the bag from your leg. Hold the bag over the toilet or a clean pail. Keep the bag lower than your hips and bladder. This is so the pee (urine) does not go back into the tube. Open the pour spout. It is at the bottom of the bag. Empty the pee into the toilet or pail. Do not let the pour spout touch any surface. Put rubbing alcohol on a gauze pad or cotton ball. Use the gauze pad or cotton ball to clean the pour spout. Close the pour spout. Attach the bag to your leg with tape or a leg strap. Wash your hands with soap and water. Follow instructions for cleaning the drainage bag. Instructions can come from: The product maker. Your doctor. How to change the bag Changing the bag Replace  your bag when it starts to leak, smell bad, or look dirty. Wash your hands with soap and water. Separate the dirty bag from your leg. Pinch the catheter with your fingers so that pee does not spill out. Separate the catheter tube from the bag tube where these tubes connect (at the connection valve). Do  not let the tubes touch any surface. Clean the end of the catheter tube with an alcohol wipe. Use a different alcohol wipe to clean the end of the bag tube. Connect the catheter tube to the tube of the clean bag. Attach the clean bag to your leg with tape or a leg strap. Do not make the bag tight on your leg. Wash your hands with soap and water. General instructions A person washing hands with soap and water.   Never pull on your catheter. Never try to take it out. Doing that can hurt you. Always wash your hands before and after you touch your catheter or bag. Use a mild, fragrance-free soap. If you do not have soap and water, use hand sanitizer. Always make sure there are no twists, bends, or kinks in the catheter tube. Always make sure there are no leaks in the catheter or bag. Drink enough fluid to keep your pee pale yellow. Do not take baths, swim, or use a hot tub. If you are male, wipe from front to back after you poop (have a bowel movement). Contact a doctor if: Your catheter gets clogged. Your catheter leaks. You have signs of infection at the catheter site, such as: Redness, swelling, or pain where the catheter goes into your body. Fluid, blood, pus, or a bad smell coming from the area where the catheter goes into your body. Skin feels warm where the catheter goes into your body. You have signs of a bladder infection, such as: Fever. Chills. Pee smells worse than usual. Cloudy pee. Pain in your belly, legs, lower back, or bladder. Vomiting or feel like vomiting. Get help right away if: You see blood in the catheter. Your pee is pink or red. Your bladder feels  full. Your pee is not draining into the bag. Your catheter gets pulled out. Summary An indwelling urinary catheter is a thin tube that is placed into the bladder to help drain pee (urine) out of the body. The catheter is placed into the part of the body that drains pee from the bladder (urethra). Taking good care of your catheter will keep it working well. Always wash your hands before and after touching your catheter or bag. Never pull on your catheter or try to take it out. This information is not intended to replace advice given to you by your health care provider. Make sure you discuss any questions you have with your health care provider. Document Revised: 11/15/2020 Document Reviewed: 11/15/2020 Elsevier Patient Education  [place]   The drugs that you were given will stay in your system until tomorrow so for the next 24 hours you should not:  Drive an automobile Make any legal decisions Drink any alcoholic beverage   You may resume regular meals tomorrow.  Today it is better to start with liquids and gradually work up to solid foods.  You may eat anything you prefer, but it is better to start with liquids, then soup and crackers, and gradually work up to solid foods.   Please notify your doctor immediately if you have any unusual bleeding, trouble breathing, redness and pain at the surgery site, drainage, fever, or pain not relieved by medication.    Your post-operative visit with Dr.  is: Date:                        Time:    Please call to schedule your post-operative visit.  Additional Instructions: 

## 2021-10-31 NOTE — H&P (Signed)
Date of Initial H&P: 10/25/21   History reviewed, patient examined, no change in status, stable for surgery.

## 2021-10-31 NOTE — Transfer of Care (Signed)
Immediate Anesthesia Transfer of Care Note  Patient: Devin Adams  Procedure(s) Performed: GREEN LIGHT LASER PVP (Prostate)  Patient Location: PACU  Anesthesia Type:General  Level of Consciousness: awake and drowsy  Airway & Oxygen Therapy: Patient Spontanous Breathing and Patient connected to face mask oxygen  Post-op Assessment: Report given to RN and Post -op Vital signs reviewed and stable  Post vital signs: Reviewed and stable  Last Vitals:  Vitals Value Taken Time  BP 128/70 10/31/21 0929  Temp    Pulse 64 10/31/21 0929  Resp 17 10/31/21 0929  SpO2 98 % 10/31/21 0929  Vitals shown include unvalidated device data.  Last Pain:  Vitals:   10/31/21 0728  TempSrc: Temporal  PainSc: 0-No pain         Complications: No notable events documented.

## 2021-10-31 NOTE — Anesthesia Procedure Notes (Signed)
Procedure Name: Intubation Date/Time: 10/31/2021 8:41 AM  Performed by: Johnna Acosta, CRNAPre-anesthesia Checklist: Patient identified, Emergency Drugs available, Suction available, Patient being monitored and Timeout performed Patient Re-evaluated:Patient Re-evaluated prior to induction Oxygen Delivery Method: Circle system utilized Preoxygenation: Pre-oxygenation with 100% oxygen Induction Type: IV induction Ventilation: Mask ventilation without difficulty and Two handed mask ventilation required Laryngoscope Size: McGraph and 3 Grade View: Grade I Tube type: Oral Tube size: 7.0 mm Number of attempts: 1 Airway Equipment and Method: Stylet and Video-laryngoscopy Placement Confirmation: ETT inserted through vocal cords under direct vision, positive ETCO2 and breath sounds checked- equal and bilateral Secured at: 21 cm Tube secured with: Tape Dental Injury: Teeth and Oropharynx as per pre-operative assessment

## 2021-10-31 NOTE — Op Note (Signed)
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 Yves Dill MD  Anesthesia: General  Indications:See the history and physical also.  71 year old ( [place]: 01/29/1951) white male with BPH with lower urinary tract symptoms that has not responded to tamsulosin and tadalafil treatment. He underwent GreenLight laser vaporization back in 2015.  Evaluation in the office included cystoscopy, which showed 3 cm of lateral lobe prostatic hypertrophy with obstruction.  Most recent PSA was 1.1 ng on 06/20. A prostate ultrasound dated 07/10 indicated prostate volume of 26.4 mL. IPSS score was 14 with a quality of life score of 3.  After informed consent the above procedure was requested.     Technique and findings: After adequate general anesthesia obtained the patient was placed into dorsal lithotomy position and the perineum was prepped and draped in usual fashion.  The laser scope was coupled with a camera and visually advanced into the bladder.  Lateral lobe prostatic hypertrophy was noted with prominent right lateral lobe.  Bladder neck appeared to be widely patent.  Both ureteral orifices were identified and normally situated on the trigone with clear efflux.  No bladder mucosal lesions were identified.  At this point the XPS laser fiber was introduced through the scope and set at 72 W.  Lateral lobe tissue was then vaporized.  Power was increased to 120 W and remaining structure tissue was vaporized from the lateral neck to the verumontanum.  Bleeders were controlled with the coagulative setting.  Blood loss was minimal.  The scope was then removed and 10 cc of viscous Xylocaine instilled within the urethra and the bladder.  A 20 French Foley catheter was placed and irrigated until clear.  The procedure was then terminated and patient transferred to the  recovery room in stable condition.

## 2021-10-31 NOTE — Anesthesia Preprocedure Evaluation (Addendum)
Anesthesia Evaluation  Patient identified by MRN, date of birth, ID band Patient awake    Reviewed: Allergy & Precautions, NPO status , Patient's Chart, lab work & pertinent test results  Airway Mallampati: I  TM Distance: >3 FB Neck ROM: full    Dental no notable dental hx.    Pulmonary sleep apnea and Continuous Positive Airway Pressure Ventilation ,    Pulmonary exam normal        Cardiovascular negative cardio ROS Normal cardiovascular exam     Neuro/Psych negative neurological ROS  negative psych ROS   GI/Hepatic Neg liver ROS, GERD  Medicated and Controlled,  Endo/Other  negative endocrine ROS  Renal/GU      Musculoskeletal   Abdominal (+) + obese,   Peds  Hematology negative hematology ROS (+)   Anesthesia Other Findings Past Medical History: No date: Enlarged prostate No date: History of kidney stones No date: Hyperlipidemia No date: Insomnia, unspecified 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 No date: Sleep apnea     Comment:  CPAP  Past Surgical History: No date: APPENDECTOMY 06/16/2016: COLONOSCOPY WITH PROPOFOL; N/A     Comment:  Procedure: COLONOSCOPY WITH PROPOFOL;  Surgeon: Lucilla Lame, MD;  Location: [facility];  Service:               Endoscopy;  Laterality: N/A;  sleep apnea No date: ESOPHAGEAL DILATION No date: LASER OF PROSTATE W/ GREEN LIGHT PVP 06/16/2016: POLYPECTOMY     Comment:  Procedure: POLYPECTOMY;  Surgeon: Lucilla Lame, MD;                Location: [facility];  Service: Endoscopy;; No date: THYROID CYST EXCISION  BMI    Body Mass Index: 30.85 kg/m      Reproductive/Obstetrics negative OB ROS                            Anesthesia Physical Anesthesia Plan  ASA: 2  Anesthesia Plan: General LMA   Post-op Pain Management: Tylenol PO (pre-op)* and Toradol IV (intra-op)*   Induction: Intravenous  PONV Risk Score  and Plan: Dexamethasone, Ondansetron and Midazolam  Airway Management Planned: LMA and Oral ETT  Additional Equipment:   Intra-op Plan:   Post-operative Plan:   Informed Consent: I have reviewed the patients History and Physical, chart, labs and discussed the procedure including the risks, benefits and alternatives for the proposed anesthesia with the patient or authorized representative who has indicated his/her understanding and acceptance.     Dental Advisory Given  Plan Discussed with: Anesthesiologist, CRNA and Surgeon  Anesthesia Plan Comments:        Anesthesia Quick Evaluation

## 2021-11-01 ENCOUNTER — Encounter: Payer: Self-pay | Admitting: Urology

## 2021-11-01 NOTE — Anesthesia Postprocedure Evaluation (Signed)
Anesthesia Post Note  Patient: Devin Adams  Procedure(s) Performed: GREEN LIGHT LASER PVP (Prostate)  Patient location during evaluation: PACU Anesthesia Type: General Level of consciousness: awake and alert Pain management: pain level controlled Vital Signs Assessment: post-procedure vital signs reviewed and stable Respiratory status: spontaneous breathing, nonlabored ventilation and respiratory function stable Cardiovascular status: blood pressure returned to baseline and stable Postop Assessment: no apparent nausea or vomiting Anesthetic complications: no   No notable events documented.   Last Vitals:  Vitals:   10/31/21 1000 10/31/21 1031  BP: 126/74 127/76  Pulse: 63 60  Resp: 14 15  Temp: (!) 36.3 C 36.5 C  SpO2: 96% 98%    Last Pain:  Vitals:   10/31/21 1031  TempSrc: Temporal  PainSc: 0-No pain                 [place] Ouch

## 2022-01-19 NOTE — Progress Notes (Unsigned)
Cardiology Office Note  Date:  01/20/2022   ID:  ARMOND CUTHRELL, DOB Jan 07, 1951, MRN [id number]  PCP:  Rusty Aus, MD   Chief Complaint  Patient presents with   12 month follow up     "Doing well." Medications reviewed by the patient verbally.     HPI:  Mr. Dia is a  71 -year-old gentleman  with a history of  hyperlipidemia,   Sleep apnea, uses nasal pillow low testosterone, shortness of breath with heavy exertion with previous stress test in 2008  CT abdomen pelvis from November 2016  no significant aortic atherosclerosis CT coronary calcium score (score of zero) who presents for routine followup of his hyperlipidemia and SOB  Last seen by myself in clinic December 2021 Recent RV trip [place] for 2 months [employer] man work 15 hr a week  Active at [employer] 2x a week, walking Weight down 15 pounds over the past year or 2 Peak weight 3 years ago to 36 now up to 15  Normal glucose numbers, nondiabetic Nonsmoker  Lab work reviewed A1c 6.2 Total chol 176, LDL 96 Lovastatin daily  EKG personally reviewed by myself on todays visit Sinus bradycardia rate 58 bpm no significant ST-T wave changes  Other past medical history reviewed CT scan images reviewed with him that he had for kidney stone This shows no significant PAD extending from descending aorta thoracic region down to his femoral arteries. Distal RCA with no coronary calcification   Notes indicate that his stress test 2008 was evaluated by Dr. Verl Blalock and Dr. Percival Spanish at [place] and it was not felt that he needed further workup such as a cardiac catheterization. The inferior wall was initially read as showing some reversible changes the evaluation of static images by the above doctors did not suggest ischemia. No cardiac catheterization was performed and he has done well since then.  Mom had pacer, dad with pacer    PMH:   has a past medical history of Enlarged prostate, History of kidney stones,  Hyperlipidemia, Insomnia, unspecified, Other testicular hypofunction, and Sleep apnea.  PSH:    Past Surgical History:  Procedure Laterality Date   APPENDECTOMY     COLONOSCOPY WITH PROPOFOL N/A 06/16/2016   Procedure: COLONOSCOPY WITH PROPOFOL;  Surgeon: Lucilla Lame, MD;  Location: [facility];  Service: Endoscopy;  Laterality: N/A;  sleep apnea   ESOPHAGEAL DILATION     GREEN LIGHT LASER TURP (TRANSURETHRAL RESECTION OF PROSTATE N/A 10/31/2021   Procedure: GREEN LIGHT LASER PVP;  Surgeon: Royston Cowper, MD;  Location: [facility];  Service: Urology;  Laterality: N/A;   LASER OF PROSTATE W/ GREEN LIGHT PVP     POLYPECTOMY  06/16/2016   Procedure: POLYPECTOMY;  Surgeon: Lucilla Lame, MD;  Location: [facility];  Service: Endoscopy;;   THYROID CYST EXCISION      Current Outpatient Medications  Medication Sig Dispense Refill   famotidine (PEPCID) 40 MG tablet Take 40 mg by mouth daily.     gabapentin (NEURONTIN) 300 MG capsule Take 300 mg by mouth at bedtime.     lovastatin (MEVACOR) 40 MG tablet Take 40 mg by mouth at bedtime.     Multiple Vitamin (MULTIVITAMIN) tablet Take 1 tablet by mouth daily.     tadalafil (CIALIS) 5 MG tablet Take 5 mg by mouth daily as needed.     tamsulosin (FLOMAX) 0.4 MG CAPS capsule TAKE 1 CAPSULE BY MOUTH EVERY DAY 90 capsule 1   cetirizine (ZYRTEC)  10 MG tablet SMARTSIG:1 Pill By Mouth Daily PRN (Patient not taking: Reported on 01/20/2022)     ciprofloxacin (CIPRO) 500 MG tablet Take 1 tablet (500 mg total) by mouth 2 (two) times daily. (Patient not taking: Reported on 01/20/2022) 14 tablet 0   cyclobenzaprine (FLEXERIL) 5 MG tablet Take 1 tablet (5 mg total) by mouth 3 (three) times daily as needed for muscle spasms. (Patient not taking: Reported on 01/20/2022) 30 tablet 1   docusate sodium (COLACE) 100 MG capsule Take 2 capsules (200 mg total) by mouth 2 (two) times daily. (Patient not taking: Reported on 01/20/2022) 120 capsule 3   fluticasone  (FLONASE) 50 MCG/ACT nasal spray Place 2 sprays into both nostrils daily as needed. (Patient not taking: Reported on 01/20/2022)     Meth-Hyo-M Bl-Na Phos-Ph Sal (URIBEL) 118 MG CAPS Take 1 capsule (118 mg total) by mouth every 6 (six) hours as needed (dysuria). (Patient not taking: Reported on 01/20/2022) 40 capsule 3   No current facility-administered medications for this visit.    Allergies:   Patient has no known allergies.   Social History:  The patient  reports that he has never smoked. He has never used smokeless tobacco. He reports current alcohol use of about 3.0 standard drinks of alcohol per week. He reports that he does not use drugs.   Family History:   family history includes Brain cancer in his paternal uncle and paternal uncle; Crohn's disease in his son; Heart failure in his father, maternal grandfather, mother, and another family member; Hypertension in his father.    Review of Systems: Review of Systems  Constitutional: Negative.   HENT: Negative.    Respiratory: Negative.    Cardiovascular: Negative.   Gastrointestinal: Negative.   Musculoskeletal: Negative.   Neurological: Negative.   Psychiatric/Behavioral: Negative.    All other systems reviewed and are negative.  PHYSICAL EXAM: VS:  BP 110/72 (BP Location: Left Arm, Patient Position: Sitting, Cuff Size: Normal)   Pulse (!) 58   Ht '5\' 10"'$  (1.778 m)   Wt 216 lb 4 oz (98.1 kg)   SpO2 97%   BMI 31.03 kg/m  , BMI Body mass index is 31.03 kg/m. Constitutional:  oriented to person, place, and time. No distress.  HENT:  Head: Grossly normal Eyes:  no discharge. No scleral icterus.  Neck: No JVD, no carotid bruits  Cardiovascular: Regular rate and rhythm, no murmurs appreciated Pulmonary/Chest: Clear to auscultation bilaterally, no wheezes or rails Abdominal: Soft.  no distension.  no tenderness.  Musculoskeletal: Normal range of motion Neurological:  normal muscle tone. Coordination normal. No atrophy Skin:  Skin warm and dry Psychiatric: normal affect, pleasant  Recent Labs: No results found for requested labs within last 365 days.    Lipid Panel Lab Results  Component Value Date   CHOL 221 (H) 10/19/2018   HDL 50 10/19/2018   LDLCALC 138 (H) 10/19/2018   TRIG 191 (H) 10/19/2018      Wt Readings from Last 3 Encounters:  01/20/22 216 lb 4 oz (98.1 kg)  10/31/21 215 lb (97.5 kg)  10/22/21 215 lb (97.5 kg)      ASSESSMENT AND PLAN:  SOB (shortness of breath) - Plan: EKG 12-Lead We have encouraged continued exercise, careful diet management in an effort to lose weight.  Mixed hyperlipidemia -  On lovastatin 40 daily, 7 a week Numbers down significantly from prior peak Should have involved with weight loss  Sleep apnea, unspecified type - Plan: EKG 12-Lead Uses  nasal pillow, Recommend exercise and weight loss program  Preventive care CT coronary calcium score of 0 Cholesterol improved, active at baseline, no significant diabetes, weight trending down   Total encounter time more than 30 minutes  Greater than 50% was spent in counseling and coordination of care with the patient    Orders Placed This Encounter  Procedures   EKG 12-Lead     Signed, Esmond Plants, M.D., Ph.D. 01/20/2022  [place], [place]

## 2022-01-20 ENCOUNTER — Ambulatory Visit: Payer: Medicare Other | Attending: Cardiovascular Disease | Admitting: Cardiovascular Disease

## 2022-01-20 ENCOUNTER — Encounter: Payer: Self-pay | Admitting: Cardiovascular Disease

## 2022-01-20 VITALS — BP 110/72 | HR 58 | Ht 70.0 in | Wt 216.2 lb

## 2022-01-20 DIAGNOSIS — G4730 Sleep apnea, unspecified: Secondary | ICD-10-CM

## 2022-01-20 DIAGNOSIS — E782 Mixed hyperlipidemia: Secondary | ICD-10-CM | POA: Diagnosis not present

## 2022-01-20 DIAGNOSIS — R0602 Shortness of breath: Secondary | ICD-10-CM

## 2022-01-20 NOTE — Patient Instructions (Signed)
Medication Instructions:  No changes  If you need a refill on your cardiac medications before your next appointment, please call your pharmacy.   Lab work: No new labs needed  Testing/Procedures: No new testing needed  Follow-Up: At [facility], you and your health needs are our priority.  As part of our continuing mission to provide you with exceptional heart care, we have created designated Provider Care Teams.  These Care Teams include your primary Cardiologist (physician) and Advanced Practice Providers (APPs -  Physician Assistants and Nurse Practitioners) who all work together to provide you with the care you need, when you need it.  You will need a follow up appointment in 12 months  Providers on your designated Care Team:   Christopher Berge, NP Ryan Dunn, PA-C Cadence Furth, PA-C  COVID-19 Vaccine Information can be found at: [email or web address] For questions related to vaccine distribution or appointments, please email [email or web address] or call [phone].   

## 2022-04-30 ENCOUNTER — Ambulatory Visit: Payer: Medicare Other | Admitting: Urology

## 2022-04-30 ENCOUNTER — Encounter: Payer: Self-pay | Admitting: Urology

## 2022-04-30 VITALS — BP 143/89 | HR 73 | Ht 70.0 in | Wt 220.8 lb

## 2022-04-30 DIAGNOSIS — R399 Unspecified symptoms and signs involving the genitourinary system: Secondary | ICD-10-CM | POA: Diagnosis not present

## 2022-04-30 DIAGNOSIS — E291 Testicular hypofunction: Secondary | ICD-10-CM

## 2022-04-30 DIAGNOSIS — N529 Male erectile dysfunction, unspecified: Secondary | ICD-10-CM | POA: Diagnosis not present

## 2022-04-30 DIAGNOSIS — N401 Enlarged prostate with lower urinary tract symptoms: Secondary | ICD-10-CM | POA: Diagnosis not present

## 2022-04-30 DIAGNOSIS — Z125 Encounter for screening for malignant neoplasm of prostate: Secondary | ICD-10-CM | POA: Diagnosis not present

## 2022-04-30 DIAGNOSIS — N138 Other obstructive and reflux uropathy: Secondary | ICD-10-CM

## 2022-04-30 MED ORDER — TADALAFIL 10 MG PO TABS: 10.0000 mg | ORAL_TABLET | Freq: Every day | ORAL | 11 refills | Status: DC | PRN

## 2022-04-30 NOTE — Progress Notes (Signed)
04/30/22 4:14 PM   Devin Adams [birth year] [id number]  CC: BPH, ED, low testosterone, PSA screening  HPI: 72 year old male previously followed by Dr. Eliberto Ivory for the above issues who is transferring his care to [place].  He has undergone greenlight PVP x 2 for BPH, most recently in August 2023.  He had a fair amount of dysuria and gross hematuria for a few months afterwards but those symptoms have resolved.  He remains on Flomax.  He denies any significant bothersome urinary symptoms today.  He also has a history of low testosterone and was started on AndroGel, but he denies that he is seeing any improvement in his overall fatigue and decreased energy since starting that medication over 6 months ago.  He is on Cialis 5 mg as needed with only mild to moderate improvement in his ED.  PSA normal at 0.42 in November 2023.   PMH: Past Medical History:  Diagnosis Date   Enlarged prostate    History of kidney stones    Hyperlipidemia    Insomnia, unspecified    Other testicular hypofunction    Sleep apnea    CPAP    Surgical History: Past Surgical History:  Procedure Laterality Date   APPENDECTOMY     COLONOSCOPY WITH PROPOFOL N/A 06/16/2016   Procedure: COLONOSCOPY WITH PROPOFOL;  Surgeon: Lucilla Lame, MD;  Location: [facility];  Service: Endoscopy;  Laterality: N/A;  sleep apnea   ESOPHAGEAL DILATION     GREEN LIGHT LASER TURP (TRANSURETHRAL RESECTION OF PROSTATE N/A 10/31/2021   Procedure: GREEN LIGHT LASER PVP;  Surgeon: Royston Cowper, MD;  Location: [facility];  Service: Urology;  Laterality: N/A;   LASER OF PROSTATE W/ GREEN LIGHT PVP     POLYPECTOMY  06/16/2016   Procedure: POLYPECTOMY;  Surgeon: Lucilla Lame, MD;  Location: [facility];  Service: Endoscopy;;   THYROID CYST EXCISION      Family History: Family History  Problem Relation Age of Onset   Heart failure Mother    Heart failure Father    Hypertension Father    Heart failure Other        CHF-family  hx on mothers side   Brain cancer Paternal Uncle    Brain cancer Paternal Uncle    Heart failure Maternal Grandfather    Crohn's disease Son     Social History:  reports that he has never smoked. He has never used smokeless tobacco. He reports current alcohol use of about 3.0 standard drinks of alcohol per week. He reports that he does not use drugs.  Physical Exam: BP (!) 143/89 (BP Location: Left Arm, Patient Position: Sitting, Cuff Size: Large)   Pulse 73   Ht '5\' 10"'$  (1.778 m)   Wt 220 lb 12.8 oz (100.2 kg)   BMI 31.68 kg/m    Constitutional:  Alert and oriented, No acute distress. Cardiovascular: No clubbing, cyanosis, or edema. Respiratory: Normal respiratory effort, no increased work of breathing. GI: Abdomen is soft, nontender, nondistended, no abdominal masses   Assessment & Plan:   72 year old male with history of BPH status post greenlight laser PVP with Dr. Eliberto Ivory x 2, most recently in August 2023, normal PSA, ED with only mild to moderate improvement on 5 mg Cialis, and low testosterone on AndroGel without significant improvement in any symptoms.  Regarding his BPH, I think it is reasonable to stop the Flomax and see how he does now that he has had a recent outlet procedure, okay to resume  Flomax in the future if worsening symptoms.  In terms of the low testosterone, I recommended stopping the AndroGel since he has not had any clinical improvement on that medication.  Can recheck testosterone in 6 months.  In terms of his ED, I recommended increasing the Cialis to 10 to 20 mg on demand  -Stop Flomax and AndroGel -Cialis increased to 10 to 20 mg on demand -RTC 6 months morning testosterone, PVR, symptom check.  If doing well at that time can space to yearly follow-up   Nickolas Madrid, MD 04/30/2022  [facility] [address] [place], [place] [address] [phone]

## 2022-09-10 ENCOUNTER — Other Ambulatory Visit: Payer: Self-pay | Admitting: Sports Medicine

## 2022-09-10 DIAGNOSIS — G8929 Other chronic pain: Secondary | ICD-10-CM

## 2022-09-10 DIAGNOSIS — M25461 Effusion, right knee: Secondary | ICD-10-CM

## 2022-09-10 DIAGNOSIS — M1711 Unilateral primary osteoarthritis, right knee: Secondary | ICD-10-CM

## 2022-09-11 ENCOUNTER — Encounter: Payer: Self-pay | Admitting: Sports Medicine

## 2022-09-20 ENCOUNTER — Ambulatory Visit
Admission: RE | Admit: 2022-09-20 | Discharge: 2022-09-20 | Disposition: A | Discharge status: Home / Self Care | Payer: Medicare Other | Source: Ambulatory Visit | Attending: Sports Medicine | Admitting: Sports Medicine

## 2022-09-20 DIAGNOSIS — G8929 Other chronic pain: Secondary | ICD-10-CM

## 2022-09-20 DIAGNOSIS — M1711 Unilateral primary osteoarthritis, right knee: Secondary | ICD-10-CM

## 2022-09-20 DIAGNOSIS — M25461 Effusion, right knee: Secondary | ICD-10-CM

## 2022-10-28 ENCOUNTER — Ambulatory Visit: Payer: Medicare Other | Admitting: Urology

## 2022-10-28 ENCOUNTER — Encounter: Payer: Self-pay | Admitting: Urology

## 2022-10-28 VITALS — BP 123/80 | HR 89 | Ht 70.0 in | Wt 225.4 lb

## 2022-10-28 DIAGNOSIS — N138 Other obstructive and reflux uropathy: Secondary | ICD-10-CM

## 2022-10-28 DIAGNOSIS — Z87442 Personal history of urinary calculi: Secondary | ICD-10-CM | POA: Diagnosis not present

## 2022-10-28 DIAGNOSIS — R399 Unspecified symptoms and signs involving the genitourinary system: Secondary | ICD-10-CM

## 2022-10-28 DIAGNOSIS — N401 Enlarged prostate with lower urinary tract symptoms: Secondary | ICD-10-CM | POA: Diagnosis not present

## 2022-10-28 DIAGNOSIS — N529 Male erectile dysfunction, unspecified: Secondary | ICD-10-CM

## 2022-10-28 LAB — BLADDER SCAN AMB NON-IMAGING

## 2022-10-28 MED ORDER — TADALAFIL 10 MG PO TABS: 10.0000 mg | ORAL_TABLET | Freq: Every day | ORAL | 3 refills | Status: AC | PRN

## 2022-10-28 MED ORDER — TAMSULOSIN HCL 0.4 MG PO CAPS
0.8000 mg | ORAL_CAPSULE | Freq: Every day | ORAL | 3 refills | Status: AC
Start: 2022-10-28 — End: ?

## 2022-10-28 NOTE — Progress Notes (Signed)
   10/28/2022 11:57 AM   Devin Adams 1950-07-30 [id number]  Reason for visit: Follow up BPH, ED, PSA screening, kidney stones  HPI: 72 year old male who establish care with me in January 2024 for the above issues after previously being followed by Dr. Sheppard Penton.  Dr. Sheppard Penton performed 2 separate greenlight PVP surgeries for BPH, with most recent in August 2023.  He had some improvement in his flow afterwards, but symptoms seem to have worsened over the last few months despite Flomax.  His primary complaint is weak stream and nocturia 1-2 times overnight.  PVR today is normal at 3ml.  He drinks primarily water and occasionally beer during the day, but tries to minimize fluids prior to bedtime.  He has a history of low testosterone and previously was on AndroGel with no improvement in symptoms and opted to stop replacement.  PSA has been normal, most recently 0.42 in November 2023, likely can discontinue screening per guideline recommendations.  In terms of ED, at our last visit I recommended increasing Cialis to 10 to 20 mg on demand.  This was not affordable at [facility] and he never filled it, I recommended changing to [email or web address].  He also passed a kidney stone in February 2024.  I personally viewed and interpreted the CT from [facility] showing a 4 mm left distal ureteral stone, no other renal stones. We discussed general stone prevention strategies including adequate hydration with goal of producing 2.5 L of urine daily, increasing citric acid intake, increasing calcium intake during high oxalate meals, minimizing animal protein, and decreasing salt intake. Information about dietary recommendations given today.  He consumes a fair amount of spinach and I recommended either decreasing his volume of spinach intake or increasing calcium at the time of spinach.  -Flomax increased to 0.8 mg nightly, okay to schedule cystoscopy and TRUS today for worsening urinary symptoms to consider more  definitive outlet procedures like HOLEP -RTC 1 year PVR, KUB for stone surveillance  Sondra Come, MD  [facility] [address], [address] [place], [place] [address] [phone]

## 2022-10-28 NOTE — Patient Instructions (Addendum)
Your Cialis prescription was sent to [email or web address].  Go to the website and set up a free account, this is the least expensive place to get Cialis and typically is about $15 for 90 pills  Nocturia refers to the need to wake up during the night to urinate, which can disrupt your sleep and impact your overall well-being. Fortunately, there are several strategies you can employ to help prevent or manage nocturia. It's important to consult with your healthcare provider before making any significant changes to your routine. Here are some helpful strategies to consider:  Limit Fluid Intake Before Bed: Avoid drinking large amounts of fluids in the evening, especially within a few hours of bedtime. Consume most of your daily fluid intake earlier in the day to reduce the need to urinate at night.  Monitor Your Diet: Limit your intake of caffeine and alcohol, as these substances can increase urine production and irritate the bladder.  Avoid diet, "zero calorie," and artificially sweetened drinks, especially sodas, in the afternoon or evening. Be mindful of consuming foods and drinks with high water content before bedtime, such as watermelon and herbal teas.  Time Your Medications: If you're taking medications that contribute to increased urination, consult your healthcare provider about adjusting the timing of these medications to minimize their impact during the night.  Practice Double Voiding: Before going to bed, make an effort to empty your bladder twice within a short period. This can help reduce the amount of urine left in your bladder before sleep.  Bladder Training: Gradually increase the time between bathroom visits during the day to train your bladder to hold larger volumes of urine. Over time, this can help reduce the frequency of nighttime awakenings to urinate.  Elevate Your Legs During the Day: Elevating your legs during the day can help minimize fluid retention in your lower  extremities, which might reduce nighttime urination.  Pelvic Floor Exercises: Strengthening your pelvic floor muscles through Kegel exercises can help improve bladder control and potentially reduce the urge to urinate at night.  Create a Relaxing Bedtime Routine: Stress and anxiety can exacerbate nocturia. Engage in calming activities before bed, such as reading, listening to soothing music, or practicing relaxation techniques.  Stay Active: Engage in regular physical activity, but avoid intense exercise close to bedtime, as this can increase your body's demand for fluids.  Maintain a Healthy Weight: Excess weight can compress the bladder and contribute to bladder and urinary issues. Aim to achieve and maintain a healthy weight through a balanced diet and regular exercise.  Remember that every individual is unique, and the effectiveness of these strategies may vary. It's important to work with your healthcare provider to develop a plan that suits your specific needs and addresses any underlying causes of nocturia.

## 2023-03-19 ENCOUNTER — Ambulatory Visit: Payer: Medicare Other | Admitting: Podiatry

## 2023-05-16 ENCOUNTER — Ambulatory Visit (INDEPENDENT_AMBULATORY_CARE_PROVIDER_SITE_OTHER): Payer: Medicare Other

## 2023-05-16 ENCOUNTER — Ambulatory Visit
Admission: EM | Admit: 2023-05-16 | Discharge: 2023-05-16 | Disposition: A | Discharge status: Home / Self Care | Payer: Medicare Other | Attending: Physician Assistant | Admitting: Physician Assistant

## 2023-05-16 DIAGNOSIS — G4700 Insomnia, unspecified: Secondary | ICD-10-CM | POA: Diagnosis present

## 2023-05-16 DIAGNOSIS — J069 Acute upper respiratory infection, unspecified: Secondary | ICD-10-CM | POA: Insufficient documentation

## 2023-05-16 DIAGNOSIS — R062 Wheezing: Secondary | ICD-10-CM | POA: Diagnosis present

## 2023-05-16 DIAGNOSIS — R051 Acute cough: Secondary | ICD-10-CM | POA: Diagnosis present

## 2023-05-16 LAB — RESP PANEL BY RT-PCR (RSV, FLU A&B, COVID)  RVPGX2
Influenza A by PCR: NEGATIVE
Influenza B by PCR: NEGATIVE
Resp Syncytial Virus by PCR: NEGATIVE
SARS Coronavirus 2 by RT PCR: NEGATIVE

## 2023-05-16 MED ORDER — BENZONATATE 200 MG PO CAPS: 200.0000 mg | ORAL_CAPSULE | Freq: Three times a day (TID) | ORAL | 0 refills | Status: AC | PRN

## 2023-05-16 NOTE — ED Provider Notes (Signed)
 [facility]    CSN: [id number] Arrival date & time: 05/16/23  0815      History   Chief Complaint Chief Complaint  Patient presents with   Cough    HPI Devin Adams is a 73 y.o. male presenting for 3 to 4-day history of cough, nasal congestion and fatigue.  Denies fever, sore throat, chest pain.  Has had a little bit of wheezing but denies shortness of breath, abdominal pain, vomiting or diarrhea.  No known exposure to flu, COVID or RSV.  Had RSV last year.  States he tested negative for COVID at home.  Has not been taking any OTC meds because he has been on Ambien for the past 2 days for insomnia related to withdrawal off opioids post knee surgery.   HPI  Past Medical History:  Diagnosis Date   Enlarged prostate    History of kidney stones    Hyperlipidemia    Insomnia, unspecified    Other testicular hypofunction    Sleep apnea    CPAP    Patient Active Problem List   Diagnosis Date Noted   Major depressive disorder, recurrent, mild (HCC) 07/12/2019   Benign prostatic hyperplasia with nocturia 03/04/2019   Familial tremor 03/04/2019   Preventative health care 11/18/2017   Nocturnal leg cramps 07/08/2017   Depression 12/28/2014   Sleep apnea 12/28/2014   Elevated fasting glucose 05/19/2014   Obesity 05/03/2013   BPH (benign prostatic hyperplasia) 09/06/2012   Other testicular hypofunction 09/06/2012   Urinary hesitancy 09/06/2012   Hyperlipidemia 03/13/2011   Advanced care planning/counseling discussion 03/13/2011    Past Surgical History:  Procedure Laterality Date   APPENDECTOMY     COLONOSCOPY WITH PROPOFOL N/A 06/16/2016   Procedure: COLONOSCOPY WITH PROPOFOL;  Surgeon: [name], MD;  Location: [facility];  Service: Endoscopy;  Laterality: N/A;  sleep apnea   ESOPHAGEAL DILATION     GREEN LIGHT LASER TURP (TRANSURETHRAL RESECTION OF PROSTATE N/A 10/31/2021   Procedure: GREEN LIGHT LASER PVP;  Surgeon: [name], MD;   Location: [facility];  Service: Urology;  Laterality: N/A;   LASER OF PROSTATE W/ GREEN LIGHT PVP     POLYPECTOMY  06/16/2016   Procedure: POLYPECTOMY;  Surgeon: [name], MD;  Location: [facility];  Service: Endoscopy;;   THYROID CYST EXCISION         Home Medications    Prior to Admission medications   Medication Sig Start Date End Date Taking? Authorizing Provider  benzonatate (TESSALON) 200 MG capsule Take 1 capsule (200 mg total) by mouth 3 (three) times daily as needed. 05/16/23  Yes Eusebio Friendly B, PA-C  fluticasone (FLONASE) 50 MCG/ACT nasal spray Place 2 sprays into both nostrils daily as needed. 07/21/19  Yes [provider]  lovastatin (MEVACOR) 40 MG tablet Take 40 mg by mouth at bedtime. 04/15/19  Yes [provider]  Multiple Vitamin (MULTIVITAMIN) tablet Take 1 tablet by mouth daily.   Yes [provider]  omeprazole (PRILOSEC) 20 MG capsule Take 20 mg by mouth as needed. 01/07/22  Yes [provider]  tamsulosin (FLOMAX) 0.4 MG CAPS capsule Take 2 capsules (0.8 mg total) by mouth at bedtime. 10/28/22  Yes Sondra Come, MD  zolpidem (AMBIEN) 5 MG tablet Take 5 mg by mouth at bedtime as needed for sleep.   Yes [provider]  cetirizine (ZYRTEC) 10 MG tablet Take 10 mg by mouth daily.    [provider]  famotidine (  PEPCID) 40 MG tablet Take 40 mg by mouth daily.    [provider]  gabapentin (NEURONTIN) 300 MG capsule Take 300 mg by mouth 2 (two) times daily. 09/30/22   [provider]  meloxicam (MOBIC) 15 MG tablet Take 15 mg by mouth daily. 09/29/22   [provider]  tadalafil (CIALIS) 10 MG tablet Take 1-2 tablets (10-20 mg total) by mouth daily as needed for erectile dysfunction. 10/28/22   Sondra Come, MD  viloxazine ER (QELBREE) 100 MG 24 hr capsule Take 1 tablet by mouth daily. 08/13/22   [provider]    Family History Family History  Problem Relation Age of  Onset   Heart failure Mother    Heart failure Father    Hypertension Father    Heart failure Other        CHF-family hx on mothers side   Brain cancer Paternal Uncle    Brain cancer Paternal Uncle    Heart failure Maternal Grandfather    Crohn's disease Son     Social History Social History   Tobacco Use   Smoking status: Never   Smokeless tobacco: Never  Vaping Use   Vaping status: Never Used  Substance Use Topics   Alcohol use: Yes    Alcohol/week: 3.0 standard drinks of alcohol    Types: 3 Cans of beer per week    Comment:     Drug use: No     Allergies   Patient has no known allergies.   Review of Systems Review of Systems  Constitutional:  Positive for fatigue. Negative for fever.  HENT:  Positive for congestion and rhinorrhea. Negative for sinus pressure, sinus pain and sore throat.   Respiratory:  Positive for cough and wheezing. Negative for shortness of breath.   Cardiovascular:  Negative for chest pain.  Gastrointestinal:  Negative for abdominal pain, diarrhea, nausea and vomiting.  Musculoskeletal:  Negative for myalgias.  Neurological:  Negative for weakness, light-headedness and headaches.  Hematological:  Negative for adenopathy.  Psychiatric/Behavioral:  Positive for sleep disturbance.      Physical Exam Triage Vital Signs ED Triage Vitals  Encounter Vitals Group     BP      Systolic BP Percentile      Diastolic BP Percentile      Pulse      Resp      Temp      Temp src      SpO2      Weight      Height      Head Circumference      Peak Flow      Pain Score      Pain Loc      Pain Education      Exclude from Growth Chart    No data found.  Updated Vital Signs BP 125/77 (BP Location: Left Arm)   Pulse 85   Temp 98.3 F (36.8 C) (Oral)   Resp 16   Ht 5\' 10"  (1.778 m)   Wt 225 lb (102.1 kg)   SpO2 95%   BMI 32.28 kg/m       Physical Exam Vitals and nursing note reviewed.  Constitutional:      General: He is not in  acute distress.    Appearance: Normal appearance. He is well-developed. He is not ill-appearing.  HENT:     Head: Normocephalic and atraumatic.     Nose: Congestion present.     Mouth/Throat:  Mouth: Mucous membranes are moist.     Pharynx: Oropharynx is clear.  Eyes:     General: No scleral icterus.    Conjunctiva/sclera: Conjunctivae normal.  Cardiovascular:     Rate and Rhythm: Normal rate and regular rhythm.  Pulmonary:     Effort: Pulmonary effort is normal. No respiratory distress.     Breath sounds: Wheezing present.  Musculoskeletal:     Cervical back: Neck supple.  Skin:    General: Skin is warm and dry.     Capillary Refill: Capillary refill takes less than 2 seconds.  Neurological:     General: No focal deficit present.     Mental Status: He is alert. Mental status is at baseline.     Motor: No weakness.     Gait: Gait normal.  Psychiatric:        Mood and Affect: Mood normal.        Behavior: Behavior normal.      UC Treatments / Results  Labs (all labs ordered are listed, but only abnormal results are displayed) Labs Reviewed  RESP PANEL BY RT-PCR (RSV, FLU A&B, COVID)  [id number]    EKG   Radiology DG Chest 2 View Result Date: 05/16/2023 CLINICAL DATA:  Cough and congestion with wheezing for 4 days. EXAM: CHEST - 2 VIEW COMPARISON:  05/25/2020 FINDINGS: Heart size and mediastinal contours are unremarkable. No pleural fluid, interstitial edema or airspace disease. The visualized osseous structures are unremarkable. IMPRESSION: No active cardiopulmonary disease. Electronically Signed   By: Signa Kell M.D.   On: 05/16/2023 09:50    Procedures Procedures (including critical care time)  Medications Ordered in UC Medications - No data to display  Initial Impression / Assessment and Plan / UC Course  I have reviewed the triage vital signs and the nursing notes.  Pertinent labs & imaging results that were available during my care of the patient were  reviewed by me and considered in my medical decision making (see chart for details).   73 year old male presents for 3 to 4-day history of cough, congestion/runny nose, wheezing.  Denies fever, sore throat, chest pain, shortness of breath.  History of insomnia for the past 3 weeks.  Has been on Ambien prescribed by PCP for the past 2 days.  Not taking any OTC meds.  Vital signs stable and normal and he is overall well-appearing.  No acute distress.  On exam has nasal congestion.  Throat clear.  Minimal wheezes.  Respiratory panel and chest x-ray obtained.  All negative.  Viral URI.  Supportive care encouraged increasing rest and fluids.  Sent benzonatate to pharmacy.  Also encouraged use of plain Mucinex and Flonase.  Advised typical course of both viruses.  Reviewed returning for fever, worsening cough, increased breathing difficulty or weakness.  To continue Ambien as prescribed by PCP.  Advised to discuss increasing dose if the medicine is not really helping much.  He plans to contact his PCP on Monday.   Final Clinical Impressions(s) / UC Diagnoses   Final diagnoses:  Acute cough  Viral upper respiratory tract infection  Wheezing  Insomnia, unspecified type     Discharge Instructions      -Negative COVID, flu, RSV and normal chest x-ray. -You have a virus.  I sent cough medicine to the pharmacy.  Increase rest and fluids. - Continue Ambien as prescribed.  Talk with your primary care provider about increasing the dose if the dose you are taking is not working so well. -  For any bad side effects to Ambien, discontinue it and talk to your primary doctor. - Most viruses get better within a couple of weeks.  If you develop a fever, weakness or increased breathing difficulty please return for reevaluation or go to the ER.     ED Prescriptions     Medication Sig Dispense Auth. Provider   benzonatate (TESSALON) 200 MG capsule Take 1 capsule (200 mg total) by mouth 3 (three) times  daily as needed. 30 capsule Shirlee Latch, PA-C      PDMP not reviewed this encounter.   Shirlee Latch, PA-C 05/16/23 1007

## 2023-05-16 NOTE — ED Triage Notes (Signed)
 Pt c/o nasal congestion and cough x4days  Pt had knee surgery in November and was given oxycodone and believes he is having oxycodone withdrawals.   Pt was given zolpidem and was told not to take anything that would impact the medication.  Pt asks if anything could be given that could help the cough and congestion.  Pt states that he has a lot of nasal congestion  Pt states that he hasn't slept in 3 weeks

## 2023-05-16 NOTE — Discharge Instructions (Signed)
-  Negative COVID, flu, RSV and normal chest x-ray. -You have a virus.  I sent cough medicine to the pharmacy.  Increase rest and fluids. - Continue Ambien as prescribed.  Talk with your primary care provider about increasing the dose if the dose you are taking is not working so well. - For any bad side effects to Ambien, discontinue it and talk to your primary doctor. - Most viruses get better within a couple of weeks.  If you develop a fever, weakness or increased breathing difficulty please return for reevaluation or go to the ER.

## 2023-05-18 NOTE — Progress Notes (Unsigned)
 Cardiology Office Note  Date:  05/19/2023   ID:  Devin Adams, DOB 1951-02-07, MRN [id number]  PCP:  Danella Penton, MD   Chief Complaint  Patient presents with   12 month follow up     Patient c/o insomnia & racing heart beats while sitting in his recliner yesterday evening. Patient just getting over the flu x 1 week ago.     HPI:  Devin Adams is a  73 -year-old gentleman  with a history of  hyperlipidemia,   Sleep apnea, uses nasal pillow low testosterone, shortness of breath with heavy exertion with previous stress test in 2008  CT abdomen pelvis from November 2016  no significant aortic atherosclerosis CT coronary calcium score (score of zero) who presents for routine followup of his hyperlipidemia and SOB  Last seen by myself in clinic 10/23 Retired Several months ago underwent TKR on right  Reports having terrible symptoms of insomnia over the past month Stopped gabapentin, stopped his pain medication, since then with insomnia Trying ambien 5 mg, now trying 10 mg Was able to sleep last night on Ambien 10  Has been less active recently Year ago was doing water aerobics, 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  nondiabetic Nonsmoker  Lab work reviewed A1c 6.4 Total chol 174,  LDL 104 on lovastatin 40 Lovastatin daily  EKG personally reviewed by myself on todays visit EKG Interpretation Date/Time:  Tuesday May 19 2023 14:48:18 EST Ventricular Rate:  98 PR Interval:  214 QRS Duration:  82 QT Interval:  312 QTC Calculation: 398 R Axis:   -12  Text Interpretation: Sinus rhythm with 1st degree A-V block with occasional Premature ventricular complexes When compared with ECG of 28-Oct-2021 13:17, Premature ventricular complexes are now Present Confirmed by [name] [phone]) on 05/19/2023 2:53:21 PM   Other past medical history reviewed CT scan images reviewed with him that he had for kidney stone This shows no significant PAD extending from descending aorta  thoracic region down to his femoral arteries. Distal RCA with no coronary calcification   Notes indicate that his stress test 2008 was evaluated by Dr. Daleen Squibb and Dr. Antoine Poche at [facility] and it was not felt that he needed further workup such as a cardiac catheterization. The inferior wall was initially read as showing some reversible changes the evaluation of static images by the above doctors did not suggest ischemia. No cardiac catheterization was performed and he has done well since then.  Mom had pacer, dad with pacer    PMH:   has a past medical history of Enlarged prostate, History of kidney stones, Hyperlipidemia, Insomnia, unspecified, Other testicular hypofunction, and Sleep apnea.  PSH:    Past Surgical History:  Procedure Laterality Date   APPENDECTOMY     COLONOSCOPY WITH PROPOFOL N/A 06/16/2016   Procedure: COLONOSCOPY WITH PROPOFOL;  Surgeon: [name], MD;  Location: [facility];  Service: Endoscopy;  Laterality: N/A;  sleep apnea   ESOPHAGEAL DILATION     GREEN LIGHT LASER TURP (TRANSURETHRAL RESECTION OF PROSTATE N/A 10/31/2021   Procedure: GREEN LIGHT LASER PVP;  Surgeon: [name], MD;  Location: [facility];  Service: Urology;  Laterality: N/A;   LASER OF PROSTATE W/ GREEN LIGHT PVP     POLYPECTOMY  06/16/2016   Procedure: POLYPECTOMY;  Surgeon: [name], MD;  Location: [facility];  Service: Endoscopy;;   THYROID CYST EXCISION      Current Outpatient Medications  Medication Sig Dispense Refill   cetirizine (ZYRTEC)  10 MG tablet Take 10 mg by mouth as needed.     famotidine (PEPCID) 40 MG tablet Take 40 mg by mouth as needed.     fluticasone (FLONASE) 50 MCG/ACT nasal spray Place 2 sprays into both nostrils daily as needed.     lovastatin (MEVACOR) 40 MG tablet Take 40 mg by mouth at bedtime.     Multiple Vitamin (MULTIVITAMIN) tablet Take 1 tablet by mouth daily.     omeprazole (PRILOSEC) 20 MG capsule Take 20 mg by mouth as needed.      tadalafil (CIALIS) 10 MG tablet Take 1-2 tablets (10-20 mg total) by mouth daily as needed for erectile dysfunction. 90 tablet 3   zolpidem (AMBIEN) 5 MG tablet Take 5 mg by mouth at bedtime as needed for sleep.     benzonatate (TESSALON) 200 MG capsule Take 1 capsule (200 mg total) by mouth 3 (three) times daily as needed. (Patient not taking: Reported on 05/19/2023) 30 capsule 0   gabapentin (NEURONTIN) 300 MG capsule Take 300 mg by mouth 2 (two) times daily. (Patient not taking: Reported on 05/19/2023)     meloxicam (MOBIC) 15 MG tablet Take 15 mg by mouth daily. (Patient not taking: Reported on 05/19/2023)     tamsulosin (FLOMAX) 0.4 MG CAPS capsule Take 2 capsules (0.8 mg total) by mouth at bedtime. (Patient not taking: Reported on 05/19/2023) 90 capsule 3   viloxazine ER (QELBREE) 100 MG 24 hr capsule Take 1 tablet by mouth daily. (Patient not taking: Reported on 05/19/2023)     No current facility-administered medications for this visit.    Allergies:   Patient has no known allergies.   Social History:  The patient  reports that he has never smoked. He has never used smokeless tobacco. He reports current alcohol use of about 3.0 standard drinks of alcohol per week. He reports that he does not use drugs.   Family History:   family history includes Brain cancer in his paternal uncle and paternal uncle; Crohn's disease in his son; Heart failure in his father, maternal grandfather, mother, and another family member; Hypertension in his father.    Review of Systems: Review of Systems  Constitutional: Negative.   HENT: Negative.    Respiratory: Negative.    Cardiovascular: Negative.   Gastrointestinal: Negative.   Musculoskeletal: Negative.   Neurological: Negative.   Psychiatric/Behavioral: Negative.    All other systems reviewed and are negative.  PHYSICAL EXAM: VS:  BP 110/80 (BP Location: Left Arm, Patient Position: Sitting, Cuff Size: Normal)   Pulse 98   Ht 5\' 10"  (1.778 m)   Wt  212 lb (96.2 kg)   SpO2 97%   BMI 30.42 kg/m  , BMI Body mass index is 30.42 kg/m. Constitutional:  oriented to person, place, and time. No distress.  HENT:  Head: Grossly normal Eyes:  no discharge. No scleral icterus.  Neck: No JVD, no carotid bruits  Cardiovascular: Regular rate and rhythm, no murmurs appreciated Pulmonary/Chest: Clear to auscultation bilaterally, no wheezes or rails Abdominal: Soft.  no distension.  no tenderness.  Musculoskeletal: Normal range of motion Neurological:  normal muscle tone. Coordination normal. No atrophy Skin: Skin warm and dry Psychiatric: normal affect, pleasant  Recent Labs: No results found for requested labs within last 365 days.    Lipid Panel Lab Results  Component Value Date   CHOL 221 (H) 10/19/2018   HDL 50 10/19/2018   LDLCALC 138 (H) 10/19/2018   TRIG 191 (H) 10/19/2018  Wt Readings from Last 3 Encounters:  05/19/23 212 lb (96.2 kg)  05/16/23 225 lb (102.1 kg)  10/28/22 225 lb 6.4 oz (102.2 kg)      ASSESSMENT AND PLAN:  SOB (shortness of breath) - Plan: EKG 12-Lead Stable symptoms, recommended calorie restriction, walking program  Mixed hyperlipidemia -  On lovastatin 40 daily, 7 a week Reasonable cholesterol 170  Sleep apnea, unspecified type - Plan: EKG 12-Lead Uses nasal pillow, Recommend exercise and calorie striction  Preventive care CT coronary calcium score of 0 Cholesterol 170,  no significant diabetes,  No significant aortic atherosclerosis on prior CT scans No further test needed    Orders Placed This Encounter  Procedures   EKG 12-Lead     Signed, Dossie Arbour, M.D., Ph.D. 05/19/2023  [facility] [place], [place] [phone]

## 2023-05-19 ENCOUNTER — Encounter: Payer: Self-pay | Admitting: Cardiovascular Disease

## 2023-05-19 ENCOUNTER — Ambulatory Visit: Payer: Medicare Other | Attending: Cardiovascular Disease | Admitting: Cardiovascular Disease

## 2023-05-19 VITALS — BP 110/80 | HR 98 | Ht 70.0 in | Wt 212.0 lb

## 2023-05-19 DIAGNOSIS — E782 Mixed hyperlipidemia: Secondary | ICD-10-CM | POA: Diagnosis not present

## 2023-05-19 DIAGNOSIS — G4730 Sleep apnea, unspecified: Secondary | ICD-10-CM | POA: Diagnosis not present

## 2023-05-19 DIAGNOSIS — R0602 Shortness of breath: Secondary | ICD-10-CM

## 2023-05-19 NOTE — Patient Instructions (Signed)

## 2023-10-26 ENCOUNTER — Other Ambulatory Visit: Payer: Self-pay

## 2023-10-26 DIAGNOSIS — N200 Calculus of kidney: Secondary | ICD-10-CM

## 2023-10-28 ENCOUNTER — Ambulatory Visit: Payer: Self-pay | Admitting: Urology

## 2023-10-28 DIAGNOSIS — N138 Other obstructive and reflux uropathy: Secondary | ICD-10-CM

## 2023-11-17 ENCOUNTER — Ambulatory Visit (INDEPENDENT_AMBULATORY_CARE_PROVIDER_SITE_OTHER): Admitting: Urology

## 2023-11-17 ENCOUNTER — Ambulatory Visit
Admission: RE | Admit: 2023-11-17 | Discharge: 2023-11-17 | Disposition: A | Discharge status: Home / Self Care | Source: Ambulatory Visit | Attending: Urology | Admitting: Urology

## 2023-11-17 ENCOUNTER — Ambulatory Visit: Admission: RE | Admit: 2023-11-17 | Source: Home / Self Care

## 2023-11-17 VITALS — BP 136/77 | HR 66 | Wt 222.0 lb

## 2023-11-17 DIAGNOSIS — N529 Male erectile dysfunction, unspecified: Secondary | ICD-10-CM

## 2023-11-17 DIAGNOSIS — N401 Enlarged prostate with lower urinary tract symptoms: Secondary | ICD-10-CM | POA: Diagnosis not present

## 2023-11-17 DIAGNOSIS — N138 Other obstructive and reflux uropathy: Secondary | ICD-10-CM | POA: Diagnosis not present

## 2023-11-17 DIAGNOSIS — N200 Calculus of kidney: Secondary | ICD-10-CM

## 2023-11-17 DIAGNOSIS — Z125 Encounter for screening for malignant neoplasm of prostate: Secondary | ICD-10-CM | POA: Diagnosis not present

## 2023-11-17 LAB — BLADDER SCAN AMB NON-IMAGING

## 2023-11-17 NOTE — Progress Notes (Signed)
   11/17/2023 1:36 PM   Devin Adams [birth year] [id number]  Reason for visit: Follow up BPH, ED, PSA screening, kidney stones  HPI: 73 year old male who establish care with me in January 2024 for the above issues after previously being followed by Dr. Gala.  Dr. Gala performed 2 separate greenlight PVP surgeries for BPH, with most recent in August 2023.    He has a history of low testosterone and previously was on AndroGel with no improvement in symptoms and opted to stop replacement.  PSA has been normal, most recently 0.48 in November 2024, likely can discontinue screening per guideline recommendations.  In terms of ED, not getting adequate erections despite Cialis  10 mg daily.  I previously had recommended the 20 mg dose but for unclear reasons has not yet tried this.  Again recommended trying the 20 mg Cialis  as needed.  Would also try 10 mg daily with 10 mg boost dose.  Denies any significant urinary symptoms at this time, previously was on Flomax  but discontinued this medication secondary to insomnia that he attributed to Flomax .  PVR today is normal at 10 mL.  He passed a small kidney stone in February 2024.  No stone events since then.  I personally viewed and interpreted his KUB today shows no evidence of stone disease.  - Cialis  increased to 20 mg on demand -RTC 1 year PVR, KUB for stone surveillance  Devin JAYSON Burnet, MD  [facility] [address], [address] [place], [place] [address] [phone]

## 2024-01-21 ENCOUNTER — Ambulatory Visit: Payer: Self-pay

## 2024-01-21 DIAGNOSIS — D132 Benign neoplasm of duodenum: Secondary | ICD-10-CM | POA: Diagnosis not present

## 2024-01-21 DIAGNOSIS — K222 Esophageal obstruction: Secondary | ICD-10-CM | POA: Diagnosis present

## 2024-02-23 ENCOUNTER — Ambulatory Visit: Admitting: Anesthesiology

## 2024-02-23 ENCOUNTER — Encounter

## 2024-02-23 ENCOUNTER — Encounter: Payer: Self-pay | Admitting: Anesthesiology

## 2024-02-23 ENCOUNTER — Encounter
Admission: RE | Disposition: A | Discharge status: Home / Self Care | Payer: Self-pay | Source: Home / Self Care | Attending: Gastroenterology

## 2024-02-23 ENCOUNTER — Other Ambulatory Visit: Payer: Self-pay

## 2024-02-23 ENCOUNTER — Ambulatory Visit
Admission: RE | Admit: 2024-02-23 | Discharge: 2024-02-23 | Disposition: A | Discharge status: Home / Self Care | Attending: Gastroenterology | Admitting: Gastroenterology

## 2024-02-23 DIAGNOSIS — K222 Esophageal obstruction: Secondary | ICD-10-CM | POA: Insufficient documentation

## 2024-02-23 DIAGNOSIS — E66813 Obesity, class 3: Secondary | ICD-10-CM | POA: Diagnosis not present

## 2024-02-23 DIAGNOSIS — K449 Diaphragmatic hernia without obstruction or gangrene: Secondary | ICD-10-CM | POA: Diagnosis not present

## 2024-02-23 DIAGNOSIS — K2289 Other specified disease of esophagus: Secondary | ICD-10-CM | POA: Diagnosis not present

## 2024-02-23 DIAGNOSIS — G4730 Sleep apnea, unspecified: Secondary | ICD-10-CM | POA: Diagnosis not present

## 2024-02-23 DIAGNOSIS — Z6832 Body mass index (BMI) 32.0-32.9, adult: Secondary | ICD-10-CM | POA: Insufficient documentation

## 2024-02-23 HISTORY — PX: ESOPHAGOGASTRODUODENOSCOPY: SHX5428

## 2024-02-23 SURGERY — EGD (ESOPHAGOGASTRODUODENOSCOPY)
Anesthesia: General

## 2024-02-23 MED ORDER — PROPOFOL 1000 MG/100ML IV EMUL
INTRAVENOUS | Status: AC
Start: 2024-02-23 — End: 2024-02-23
  Filled 2024-02-23: qty 100

## 2024-02-23 MED ORDER — LIDOCAINE HCL (CARDIAC) PF 100 MG/5ML IV SOSY
PREFILLED_SYRINGE | INTRAVENOUS | Status: DC | PRN
  Administered 2024-02-23: 100 mg via INTRAVENOUS

## 2024-02-23 MED ORDER — LIDOCAINE HCL (PF) 2 % IJ SOLN
INTRAMUSCULAR | Status: AC
  Filled 2024-02-23: qty 5

## 2024-02-23 MED ORDER — SODIUM CHLORIDE 0.9 % IV SOLN: INTRAVENOUS | Status: DC

## 2024-02-23 MED ORDER — PROPOFOL 10 MG/ML IV BOLUS
INTRAVENOUS | Status: DC | PRN
  Administered 2024-02-23: 100 mg via INTRAVENOUS

## 2024-02-23 NOTE — Anesthesia Postprocedure Evaluation (Signed)
 Anesthesia Post Note  Patient: Devin Adams  Procedure(s) Performed: EGD (ESOPHAGOGASTRODUODENOSCOPY)  Patient location during evaluation: PACU Anesthesia Type: General Level of consciousness: awake and awake and alert Pain management: satisfactory to patient Vital Signs Assessment: post-procedure vital signs reviewed and stable Respiratory status: spontaneous breathing Cardiovascular status: stable Anesthetic complications: no   There were no known notable events for this encounter.   Last Vitals:  Vitals:   02/23/24 1205 02/23/24 1215  BP: 110/60 116/72  Pulse: (!) 56 (!) 56  Resp: 15 11  Temp:    SpO2: 95% 95%    Last Pain:  Vitals:   02/23/24 1215  TempSrc:   PainSc: 0-No pain                 VAN STAVEREN,Burton Gahan

## 2024-02-23 NOTE — H&P (Signed)
 Outpatient short stay form Pre-procedure 02/23/2024  Devin ONEIDA Schick, MD  Primary Physician: Cleotilde Oneil FALCON, MD  Reason for visit:  Esophageal Stricture  History of present illness:    73 y/o gentleman with history of OSA, obesity, and esophageal strictures here for EGD for possible dilation. Had EGD about 1 month ago with two strictures that were dilated. No blood thinners. No neck surgeries. No neck radiation.    Current Facility-Administered Medications:    0.9 %  sodium chloride  infusion, , Intravenous, Continuous, Asencion Guisinger, Devin ONEIDA, MD, Last Rate: 20 mL/hr at 02/23/24 1116, Continued from Pre-op at 02/23/24 1116  Medications Prior to Admission  Medication Sig Dispense Refill Last Dose/Taking   celecoxib (CELEBREX) 200 MG capsule TAKE 1 CAPSULE (200 MG TOTAL) BY MOUTH DAILY WITH BREAKFAST   02/22/2024   cetirizine  (ZYRTEC ) 10 MG tablet Take 10 mg by mouth as needed.   02/22/2024   famotidine (PEPCID) 40 MG tablet Take 40 mg by mouth as needed.   02/22/2024   fluticasone (FLONASE) 50 MCG/ACT nasal spray Place 2 sprays into both nostrils daily as needed.   02/22/2024   gabapentin  (NEURONTIN ) 300 MG capsule Take 300 mg by mouth 2 (two) times daily.   02/22/2024   lovastatin  (MEVACOR ) 40 MG tablet Take 40 mg by mouth at bedtime.   02/22/2024   Multiple Vitamin (MULTIVITAMIN) tablet Take 1 tablet by mouth daily.   Past Week   omeprazole (PRILOSEC) 20 MG capsule Take 20 mg by mouth as needed.   02/22/2024   aspirin EC 81 MG tablet Take 81 mg by mouth 2 (two) times daily. (Patient not taking: Reported on 02/23/2024)   Not Taking   benzonatate  (TESSALON ) 200 MG capsule Take 1 capsule (200 mg total) by mouth 3 (three) times daily as needed. (Patient not taking: Reported on 11/17/2023) 30 capsule 0    Docusate Sodium  (DSS) 100 MG CAPS Take 2 capsules by mouth 2 (two) times daily.      meloxicam (MOBIC) 15 MG tablet Take 15 mg by mouth daily. (Patient not taking: Reported on 05/19/2023)       tadalafil  (CIALIS ) 10 MG tablet Take 1-2 tablets (10-20 mg total) by mouth daily as needed for erectile dysfunction. 90 tablet 3    tamsulosin  (FLOMAX ) 0.4 MG CAPS capsule Take 2 capsules (0.8 mg total) by mouth at bedtime. (Patient not taking: Reported on 11/17/2023) 90 capsule 3    viloxazine ER (QELBREE) 100 MG 24 hr capsule Take 1 tablet by mouth daily. (Patient not taking: Reported on 05/19/2023)      zolpidem (AMBIEN) 5 MG tablet Take 5 mg by mouth at bedtime as needed for sleep.        No Known Allergies   Past Medical History:  Diagnosis Date   Enlarged prostate    History of kidney stones    Hyperlipidemia    Insomnia, unspecified    Other testicular hypofunction    Sleep apnea    CPAP    Review of systems:  Otherwise negative.    Physical Exam  Gen: Alert, oriented. Appears stated age.  HEENT: PERRLA. Lungs: No respiratory distress CV: RRR Abd: soft, benign, no masses Ext: No edema    Planned procedures: Proceed with EGD. The patient understands the nature of the planned procedure, indications, risks, alternatives and potential complications including but not limited to bleeding, infection, perforation, damage to internal organs and possible oversedation/side effects from anesthesia. The patient agrees and gives consent to proceed.  Please  refer to procedure notes for findings, recommendations and patient disposition/instructions.     Devin ONEIDA Schick, MD [facility]

## 2024-02-23 NOTE — Transfer of Care (Signed)
 Immediate Anesthesia Transfer of Care Note  Patient: Devin Adams  Procedure(s) Performed: EGD (ESOPHAGOGASTRODUODENOSCOPY)  Patient Location: PACU  Anesthesia Type:General  Level of Consciousness: awake and sedated  Airway & Oxygen Therapy: Patient Spontanous Breathing and Patient connected to face mask oxygen  Post-op Assessment: Report given to RN and Post -op Vital signs reviewed and stable  Post vital signs: Reviewed and stable  Last Vitals:  Vitals Value Taken Time  BP    Temp    Pulse    Resp    SpO2      Last Pain:  Vitals:   02/23/24 1103  TempSrc: Temporal  PainSc: 0-No pain         Complications: There were no known notable events for this encounter.

## 2024-02-23 NOTE — Anesthesia Preprocedure Evaluation (Signed)
 Anesthesia Evaluation  Patient identified by MRN, date of birth, ID band Patient awake    Reviewed: Allergy & Precautions, NPO status , Patient's Chart, lab work & pertinent test results  Airway Mallampati: III  TM Distance: >3 FB Neck ROM: full    Dental  (+) Teeth Intact   Pulmonary neg pulmonary ROS, sleep apnea and Continuous Positive Airway Pressure Ventilation    Pulmonary exam normal        Cardiovascular Exercise Tolerance: Good negative cardio ROS Normal cardiovascular exam Rhythm:Regular Rate:Normal     Neuro/Psych    Depression    negative neurological ROS  negative psych ROS   GI/Hepatic negative GI ROS, Neg liver ROS,,,  Endo/Other  negative endocrine ROS  Class 3 obesity  Renal/GU negative Renal ROS  negative genitourinary   Musculoskeletal   Abdominal  (+) + obese  Peds negative pediatric ROS (+)  Hematology negative hematology ROS (+)   Anesthesia Other Findings Past Medical History: No date: Enlarged prostate No date: History of kidney stones No date: Hyperlipidemia No date: Insomnia, unspecified 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 No date: Sleep apnea     Comment:  CPAP  Past Surgical History: No date: APPENDECTOMY 06/16/2016: COLONOSCOPY WITH PROPOFOL ; N/A     Comment:  Procedure: COLONOSCOPY WITH PROPOFOL ;  Surgeon: Rogelia Copping, MD;  Location: [facility];  Service:               Endoscopy;  Laterality: N/A;  sleep apnea No date: ESOPHAGEAL DILATION 10/31/2021: GREEN LIGHT LASER TURP (TRANSURETHRAL RESECTION OF  PROSTATE; N/A     Comment:  Procedure: GREEN LIGHT LASER PVP;  Surgeon: Kassie Ozell SAUNDERS, MD;  Location: [facility];  Service: Urology;                Laterality: N/A; No date: LASER OF PROSTATE W/ GREEN LIGHT PVP 06/16/2016: POLYPECTOMY     Comment:  Procedure: POLYPECTOMY;  Surgeon: Rogelia Copping, MD;                Location: [facility];  Service: Endoscopy;; No date: THYROID CYST EXCISION  BMI    Body Mass Index: 32.14 kg/m      Reproductive/Obstetrics negative OB ROS                              Anesthesia Physical Anesthesia Plan  ASA: 3  Anesthesia Plan: General   Post-op Pain Management:    Induction: Intravenous  PONV Risk Score and Plan: Propofol  infusion and TIVA  Airway Management Planned: Natural Airway and Nasal Cannula  Additional Equipment:   Intra-op Plan:   Post-operative Plan:   Informed Consent: I have reviewed the patients History and Physical, chart, labs and discussed the procedure including the risks, benefits and alternatives for the proposed anesthesia with the patient or authorized representative who has indicated his/her understanding and acceptance.     Dental Advisory Given  Plan Discussed with: CRNA  Anesthesia Plan Comments:         Anesthesia Quick Evaluation

## 2024-02-23 NOTE — Interval H&P Note (Signed)
 History and Physical Interval Note:  02/23/2024 11:22 AM  Devin Adams Carpen  has presented today for surgery, with the diagnosis of dysphagia.  The various methods of treatment have been discussed with the patient and family. After consideration of risks, benefits and other options for treatment, the patient has consented to  Procedure(s): EGD (ESOPHAGOGASTRODUODENOSCOPY) (N/A) as a surgical intervention.  The patient's history has been reviewed, patient examined, no change in status, stable for surgery.  I have reviewed the patient's chart and labs.  Questions were answered to the patient's satisfaction.     Devin Adams Schick  Ok to proceed with EGD

## 2024-02-23 NOTE — Op Note (Signed)
 [facility] Gastroenterology Patient Name: Devin Adams Procedure Date: 02/23/2024 11:38 AM MRN: [id number] Account #: [id number] Date of Birth: 1951-02-16 Admit Type: Outpatient Age: 73 Room: [facility] ENDO ROOM 1 Gender: Male Note Status: Finalized Instrument Name: Upper GI Scope [id number] Procedure:             Upper GI endoscopy Indications:           Stenosis of the esophagus Providers:             Ole Schick MD, MD Referring MD:          Oneil PHEBE Pinal, MD (Referring MD) Medicines:             Monitored Anesthesia Care Complications:         No immediate complications. Procedure:             Pre-Anesthesia Assessment:                        - Prior to the procedure, a History and Physical was                         performed, and patient medications and allergies were                         reviewed. The patient is competent. The risks and                         benefits of the procedure and the sedation options and                         risks were discussed with the patient. All questions                         were answered and informed consent was obtained.                         Patient identification and proposed procedure were                         verified by the physician, the nurse, the                         anesthesiologist, the anesthetist and the technician                         in the endoscopy suite. Mental Status Examination:                         alert and oriented. Airway Examination: normal                         oropharyngeal airway and neck mobility. Respiratory                         Examination: clear to auscultation. CV Examination:                         normal. Prophylactic Antibiotics: The patient does not  require prophylactic antibiotics. Prior                         Anticoagulants: The patient has taken no anticoagulant                         or antiplatelet agents. ASA Grade  Assessment: III - A                         patient with severe systemic disease. After reviewing                         the risks and benefits, the patient was deemed in                         satisfactory condition to undergo the procedure. The                         anesthesia plan was to use monitored anesthesia care                         (MAC). Immediately prior to administration of                         medications, the patient was re-assessed for adequacy                         to receive sedatives. The heart rate, respiratory                         rate, oxygen saturations, blood pressure, adequacy of                         pulmonary ventilation, and response to care were                         monitored throughout the procedure. The physical                         status of the patient was re-assessed after the                         procedure.                        After obtaining informed consent, the endoscope was                         passed under direct vision. Throughout the procedure,                         the patient's blood pressure, pulse, and oxygen                         saturations were monitored continuously. The Endoscope                         was introduced through the mouth, and advanced to the  second part of duodenum. The upper GI endoscopy was                         accomplished without difficulty. The patient tolerated                         the procedure well. Findings:      A large scar was found in the upper third of the esophagus. The scar       tissue was healthy in appearance.      A non-obstructing Schatzki ring was found in the distal esophagus.      A 3 cm hiatal hernia was present.      The entire examined stomach was normal.      The examined duodenum was normal. Impression:            - Scar in the upper third of the esophagus.                        - Non-obstructing Schatzki ring.                         - 3 cm hiatal hernia.                        - Normal stomach.                        - Normal examined duodenum.                        - No specimens collected. Recommendation:        - Discharge patient to home.                        - Resume previous diet.                        - Continue present medications.                        - Return to referring physician as previously                         scheduled. Monitor GI symptoms, if recurrent dysphagia                         can perform repeat EGD. Procedure Code(s):     --- Professional ---                        [phone], Esophagogastroduodenoscopy, flexible,                         transoral; diagnostic, including collection of                         specimen(s) by brushing or washing, when performed                         (separate procedure) Diagnosis Code(s):     --- Professional ---  K22.89, Other specified disease of esophagus                        K22.2, Esophageal obstruction                        K44.9, Diaphragmatic hernia without obstruction or                         gangrene CPT copyright 2022 [employer]. All rights reserved. The codes documented in this report are preliminary and upon coder review may  be revised to meet current compliance requirements. Ole Schick MD, MD 02/23/2024 11:54:27 AM Number of Addenda: 0 Note Initiated On: 02/23/2024 11:38 AM Estimated Blood Loss:  Estimated blood loss: none.      [facility]

## 2024-02-24 ENCOUNTER — Encounter: Payer: Self-pay | Admitting: Gastroenterology

## 2024-11-15 ENCOUNTER — Ambulatory Visit: Admitting: Urology
